# Patient Record
Sex: Male | Born: 1960 | Race: White | Hispanic: No | Marital: Married | Smoking: Never smoker
Health system: Southern US, Community
[De-identification: ages and names within clinical notes are randomized; demographics above are authoritative.]

## PROBLEM LIST (undated history)

## (undated) DIAGNOSIS — E119 Type 2 diabetes mellitus without complications: Secondary | ICD-10-CM

## (undated) DIAGNOSIS — K219 Gastro-esophageal reflux disease without esophagitis: Secondary | ICD-10-CM

## (undated) DIAGNOSIS — K5190 Ulcerative colitis, unspecified, without complications: Secondary | ICD-10-CM

## (undated) HISTORY — PX: COLONOSCOPY: SHX174

## (undated) HISTORY — PX: KNEE ARTHROSCOPY: SUR90

## (undated) HISTORY — PX: FLEXIBLE SIGMOIDOSCOPY: SHX1649

## (undated) HISTORY — PX: ESOPHAGOGASTRODUODENOSCOPY: SHX1529

---

## 2005-08-22 ENCOUNTER — Ambulatory Visit: Payer: Self-pay | Admitting: Unknown Physician Specialty

## 2005-09-13 ENCOUNTER — Ambulatory Visit: Payer: Self-pay | Admitting: Chiropractic Medicine

## 2007-10-22 ENCOUNTER — Ambulatory Visit: Payer: Self-pay | Admitting: Family Medicine

## 2009-08-05 ENCOUNTER — Ambulatory Visit: Payer: Self-pay | Admitting: Unknown Physician Specialty

## 2010-03-26 ENCOUNTER — Ambulatory Visit: Payer: Self-pay

## 2012-07-24 ENCOUNTER — Emergency Department: Payer: Self-pay | Admitting: Emergency Medicine

## 2012-07-24 LAB — CBC
HGB: 14.1 g/dL (ref 13.0–18.0)
MCHC: 34.2 g/dL (ref 32.0–36.0)
RBC: 5.05 10*6/uL (ref 4.40–5.90)
WBC: 8.8 10*3/uL (ref 3.8–10.6)

## 2012-07-24 LAB — BASIC METABOLIC PANEL
Anion Gap: 4 — ABNORMAL LOW (ref 7–16)
Co2: 31 mmol/L (ref 21–32)
EGFR (African American): >60
Potassium: 4.1 mmol/L (ref 3.5–5.1)

## 2015-08-17 DIAGNOSIS — G8929 Other chronic pain: Secondary | ICD-10-CM | POA: Insufficient documentation

## 2015-08-18 ENCOUNTER — Other Ambulatory Visit: Payer: Self-pay | Admitting: Unknown Physician Specialty

## 2015-08-18 DIAGNOSIS — G8929 Other chronic pain: Secondary | ICD-10-CM

## 2015-08-18 DIAGNOSIS — M25562 Pain in left knee: Principal | ICD-10-CM

## 2015-10-09 ENCOUNTER — Ambulatory Visit (INDEPENDENT_AMBULATORY_CARE_PROVIDER_SITE_OTHER): Payer: BLUE CROSS/BLUE SHIELD

## 2015-10-09 ENCOUNTER — Encounter: Payer: Self-pay | Admitting: Podiatry

## 2015-10-09 ENCOUNTER — Ambulatory Visit (INDEPENDENT_AMBULATORY_CARE_PROVIDER_SITE_OTHER): Payer: BLUE CROSS/BLUE SHIELD | Admitting: Podiatry

## 2015-10-09 VITALS — BP 141/89 | HR 79 | Resp 12

## 2015-10-09 DIAGNOSIS — M722 Plantar fascial fibromatosis: Secondary | ICD-10-CM | POA: Diagnosis not present

## 2015-10-09 MED ORDER — TRIAMCINOLONE ACETONIDE 10 MG/ML IJ SUSP
10.0000 mg | Freq: Once | INTRAMUSCULAR | Status: AC
  Administered 2015-10-09: 10 mg

## 2015-10-09 MED ORDER — DICLOFENAC SODIUM 75 MG PO TBEC: 75.0000 mg | DELAYED_RELEASE_TABLET | Freq: Two times a day (BID) | ORAL | Status: DC

## 2015-10-09 NOTE — Progress Notes (Signed)
   Subjective:    Patient ID: Larry AtesKenneth R Keator, male    DOB: [birth year], 55 y.o.   MRN: [id number]  HPI  PT STATED RT FOOT HEEL IS WORSE THAN LT FOOT FOR 1 MONTH. FEET ARE WORSE ESPECIALLY WHEN WALKING AND TRIED NO TREATMENT.  Review of Systems  Musculoskeletal: Positive for gait problem.       Objective:   Physical Exam        Assessment & Plan:

## 2015-10-09 NOTE — Patient Instructions (Signed)
Plantar Fasciitis Plantar fasciitis is a painful foot condition that affects the heel. It occurs when the band of tissue that connects the toes to the heel bone (plantar fascia) becomes irritated. This can happen after exercising too much or doing other repetitive activities (overuse injury). The pain from plantar fasciitis can range from mild irritation to severe pain that makes it difficult for you to walk or move. The pain is usually worse in the morning or after you have been sitting or lying down for a while. CAUSES This condition may be caused by:  Standing for long periods of time.  Wearing shoes that do not fit.  Doing high-impact activities, including running, aerobics, and ballet.  Being overweight.  Having an abnormal way of walking (gait).  Having tight calf muscles.  Having high arches in your feet.  Starting a new athletic activity. SYMPTOMS The main symptom of this condition is heel pain. Other symptoms include:  Pain that gets worse after activity or exercise.  Pain that is worse in the morning or after resting.  Pain that goes away after you walk for a few minutes. DIAGNOSIS This condition may be diagnosed based on your signs and symptoms. Your health care provider will also do a physical exam to check for:  A tender area on the bottom of your foot.  A high arch in your foot.  Pain when you move your foot.  Difficulty moving your foot. You may also need to have imaging studies to confirm the diagnosis. These can include:  X-rays.  Ultrasound.  MRI. TREATMENT  Treatment for plantar fasciitis depends on the severity of the condition. Your treatment may include:  Rest, ice, and over-the-counter pain medicines to manage your pain.  Exercises to stretch your calves and your plantar fascia.  A splint that holds your foot in a stretched, upward position while you sleep (night splint).  Physical therapy to relieve symptoms and prevent problems in the  future.  Cortisone injections to relieve severe pain.  Extracorporeal shock wave therapy (ESWT) to stimulate damaged plantar fascia with electrical impulses. It is often used as a last resort before surgery.  Surgery, if other treatments have not worked after 12 months. HOME CARE INSTRUCTIONS  Take medicines only as directed by your health care provider.  Avoid activities that cause pain.  Roll the bottom of your foot over a bag of ice or a bottle of cold water. Do this for 20 minutes, 3-4 times a day.  Perform simple stretches as directed by your health care provider.  Try wearing athletic shoes with air-sole or gel-sole cushions or soft shoe inserts.  Wear a night splint while sleeping, if directed by your health care provider.  Keep all follow-up appointments with your health care provider. PREVENTION   Do not perform exercises or activities that cause heel pain.  Consider finding low-impact activities if you continue to have problems.  Lose weight if you need to. The best way to prevent plantar fasciitis is to avoid the activities that aggravate your plantar fascia. SEEK MEDICAL CARE IF:  Your symptoms do not go away after treatment with home care measures.  Your pain gets worse.  Your pain affects your ability to move or do your daily activities.   This information is not intended to replace advice given to you by your health care provider. Make sure you discuss any questions you have with your health care provider.   Document Released: 03/22/2001 Document Revised: 03/18/2015 Document Reviewed: 05/07/2014 Elsevier   Interactive Patient Education 2016 [employer]  

## 2015-10-09 NOTE — Progress Notes (Signed)
Subjective:     Patient ID: Larry AtesKenneth R Zimmerle, male   DOB: 07-24-1960, 55 y.o.   MRN: [id number]  HPI patient presents stating he's having a lot of heel pain right over left and it's been going on for a while and is worsened over the last month. He's had a history at this and the past and does have relatively flatfoot deformity   Review of Systems  All other systems reviewed and are negative.      Objective:   Physical Exam  Constitutional: He is oriented to person, place, and time.  Cardiovascular: Intact distal pulses.   Musculoskeletal: Normal range of motion.  Neurological: He is oriented to person, place, and time.  Skin: Skin is warm.  Nursing note and vitals reviewed.  neurovascular status is found to be intact with muscle strength adequate range of motion within normal limits with patient found to have exquisite discomfort plantar fascial right over left at the insertional point of the tendon into the calcaneus. Patient is noted to have moderate flatfoot deformity bilateral and there is fluid around the medial tendon. Patient has good digital perfusion and is well oriented 3     Assessment:     Plantar fasciitis right over left with inflammatory changes    Plan:     H&P and x-rays reviewed with patient. Today I injected the plantar fascia bilateral 3 mg Kenalog 5 mill grams Xylocaine and applied fascial brace right to support the arch. Gave instructions on physical therapy and placed on diclofenac 75 mg twice a day. Patient will be seen back in 1 week  X-ray report indicated spur formation right over left with depression of the arch bilateral

## 2015-10-13 ENCOUNTER — Ambulatory Visit: Payer: Self-pay | Admitting: Sports Medicine

## 2015-10-16 ENCOUNTER — Ambulatory Visit: Payer: Self-pay | Admitting: Podiatry

## 2015-10-26 ENCOUNTER — Ambulatory Visit (INDEPENDENT_AMBULATORY_CARE_PROVIDER_SITE_OTHER): Payer: BLUE CROSS/BLUE SHIELD | Admitting: Podiatry

## 2015-10-26 ENCOUNTER — Encounter: Payer: Self-pay | Admitting: Podiatry

## 2015-10-26 DIAGNOSIS — M722 Plantar fascial fibromatosis: Secondary | ICD-10-CM | POA: Diagnosis not present

## 2015-10-27 NOTE — Progress Notes (Signed)
Subjective:     Patient ID: Larry AtesKenneth R Calix, male   DOB: 10-24-1960, 55 y.o.   MRN: [id number]  HPI patient states both my heels are doing well but I do have a lot of pain and I have had a long-term history of this problem   Review of Systems     Objective:   Physical Exam Neurovascular status intact negative Homans sign noted with depression of the arch noted bilateral with inflammation around the plantar heel region bilateral that's diminished and discomfort still present    Assessment:     Structural changes with long-term history of plantar fasciitis with weightbearing job on cement floors with history of chronic fasciitis    Plan:     H&P x-rays reviewed and today I went ahead and I advised on continued physical therapy continued stretching exercises and scanned for custom orthotic devices to lift the arch up and take stress off the heel

## 2015-11-20 ENCOUNTER — Ambulatory Visit (INDEPENDENT_AMBULATORY_CARE_PROVIDER_SITE_OTHER): Payer: BLUE CROSS/BLUE SHIELD | Admitting: Podiatry

## 2015-11-20 DIAGNOSIS — M722 Plantar fascial fibromatosis: Secondary | ICD-10-CM | POA: Diagnosis not present

## 2015-11-20 MED ORDER — TRIAMCINOLONE ACETONIDE 10 MG/ML IJ SUSP
10.0000 mg | Freq: Once | INTRAMUSCULAR | Status: AC
  Administered 2015-11-20: 10 mg

## 2015-11-20 NOTE — Progress Notes (Signed)
Pt presents today to [facility], orthotics dispensed and instructions given. Pt is to follow up with any questions or concerns. 

## 2015-11-20 NOTE — Patient Instructions (Signed)

## 2015-11-20 NOTE — Progress Notes (Signed)
Subjective:     Patient ID: Larry AtesKenneth R Lloyd, male   DOB: 08-28-1960, 55 y.o.   MRN: [id number]  HPI patient states he has had a reoccurrence of pain in the right plantar heel and he is here to pickup orthotics   Review of Systems     Objective:   Physical Exam Neurovascular status intact muscle strength adequate with exquisite discomfort plantar aspect right at the insertional point tendon into the calcaneus    Assessment:     Continued plantar fasciitis right plantar heel    Plan:     Reinjected the plantar fascia right 3 mg Kenalog 5 mill grams Xylocaine and instructed on continued physical therapy orthotic usage

## 2015-12-18 ENCOUNTER — Ambulatory Visit (INDEPENDENT_AMBULATORY_CARE_PROVIDER_SITE_OTHER): Payer: BLUE CROSS/BLUE SHIELD | Admitting: Podiatry

## 2015-12-18 ENCOUNTER — Encounter: Payer: Self-pay | Admitting: Podiatry

## 2015-12-18 VITALS — BP 125/66 | HR 76 | Resp 14

## 2015-12-18 DIAGNOSIS — M722 Plantar fascial fibromatosis: Secondary | ICD-10-CM

## 2015-12-18 MED ORDER — PREDNISONE 10 MG PO TABS: ORAL_TABLET | ORAL | Status: DC

## 2015-12-18 MED ORDER — TRIAMCINOLONE ACETONIDE 10 MG/ML IJ SUSP
10.0000 mg | Freq: Once | INTRAMUSCULAR | Status: AC
  Administered 2015-12-18: 10 mg

## 2016-01-04 DIAGNOSIS — K5190 Ulcerative colitis, unspecified, without complications: Secondary | ICD-10-CM | POA: Insufficient documentation

## 2016-01-08 ENCOUNTER — Ambulatory Visit: Payer: BLUE CROSS/BLUE SHIELD | Admitting: Podiatry

## 2016-04-08 NOTE — Progress Notes (Signed)
Subjective:     Patient ID: Larry Lloyd, male   DOB: 20-Mar-1961, 55 y.o.   MRN: [id number]  HPI patient presents stating his still having pain in the heel with slight improvement but still painful   Review of Systems     Objective:   Physical Exam Neurovascular status intact with continued discomfort in the plantar fascial bands    Assessment:     Plantar fasciitis noted with pain    Plan:     I reinjected plantar fascia bilateral 3 mg Kenalog 5 mill grams Xylocaine and gave instructions on physical therapy and dispensed night splint with instructions on usage

## 2017-01-12 DIAGNOSIS — E119 Type 2 diabetes mellitus without complications: Secondary | ICD-10-CM | POA: Insufficient documentation

## 2018-01-24 DIAGNOSIS — E538 Deficiency of other specified B group vitamins: Secondary | ICD-10-CM | POA: Insufficient documentation

## 2019-03-13 ENCOUNTER — Other Ambulatory Visit: Payer: Self-pay | Admitting: Orthopedic Surgery

## 2019-03-13 DIAGNOSIS — M25362 Other instability, left knee: Secondary | ICD-10-CM

## 2019-03-13 DIAGNOSIS — M2392 Unspecified internal derangement of left knee: Secondary | ICD-10-CM

## 2019-03-13 DIAGNOSIS — M1712 Unilateral primary osteoarthritis, left knee: Secondary | ICD-10-CM

## 2019-03-21 ENCOUNTER — Other Ambulatory Visit: Payer: Self-pay

## 2019-03-21 ENCOUNTER — Ambulatory Visit
Admission: RE | Admit: 2019-03-21 | Discharge: 2019-03-21 | Disposition: A | Discharge status: Home / Self Care | Payer: BC Managed Care – PPO | Source: Ambulatory Visit | Attending: Orthopedic Surgery | Admitting: Orthopedic Surgery

## 2019-03-21 DIAGNOSIS — M1712 Unilateral primary osteoarthritis, left knee: Secondary | ICD-10-CM | POA: Diagnosis present

## 2019-03-21 DIAGNOSIS — M25362 Other instability, left knee: Secondary | ICD-10-CM | POA: Insufficient documentation

## 2019-03-21 DIAGNOSIS — M2392 Unspecified internal derangement of left knee: Secondary | ICD-10-CM | POA: Diagnosis present

## 2019-07-16 ENCOUNTER — Ambulatory Visit: Payer: BC Managed Care – PPO | Attending: Internal Medicine

## 2019-07-16 DIAGNOSIS — Z20822 Contact with and (suspected) exposure to covid-19: Secondary | ICD-10-CM

## 2019-07-16 DIAGNOSIS — U071 COVID-19: Secondary | ICD-10-CM | POA: Insufficient documentation

## 2019-07-18 ENCOUNTER — Telehealth: Payer: Self-pay

## 2019-07-18 LAB — NOVEL CORONAVIRUS, NAA: SARS-CoV-2, NAA: DETECTED — AB

## 2019-07-18 NOTE — Telephone Encounter (Signed)
Patient called and was informed that his COVID-19 test 07/16/19 was positive and he can pass the germ to others. He states he has sore throat , feels weak, and has lost taste.  Symptom tier and treatment plan for each tier was read to patient. Criteria for ending isolation were read to patient. Good preventative practices were reviewed. He verbalized understanding of all information. [facility] will be notified.

## 2019-08-14 DIAGNOSIS — Z8616 Personal history of COVID-19: Secondary | ICD-10-CM | POA: Insufficient documentation

## 2020-01-27 ENCOUNTER — Ambulatory Visit
Admission: RE | Admit: 2020-01-27 | Discharge: 2020-01-27 | Disposition: A | Discharge status: Home / Self Care | Payer: BC Managed Care – PPO | Source: Ambulatory Visit | Attending: Physician Assistant | Admitting: Physician Assistant

## 2020-01-27 ENCOUNTER — Other Ambulatory Visit: Payer: Self-pay

## 2020-01-27 ENCOUNTER — Other Ambulatory Visit: Payer: Self-pay | Admitting: Physician Assistant

## 2020-01-27 DIAGNOSIS — M7989 Other specified soft tissue disorders: Secondary | ICD-10-CM

## 2020-07-07 ENCOUNTER — Encounter: Payer: Self-pay | Admitting: Podiatry

## 2020-07-07 ENCOUNTER — Other Ambulatory Visit: Payer: Self-pay | Admitting: Podiatry

## 2020-07-07 ENCOUNTER — Ambulatory Visit: Payer: BC Managed Care – PPO | Admitting: Podiatry

## 2020-07-07 ENCOUNTER — Other Ambulatory Visit: Payer: Self-pay

## 2020-07-07 ENCOUNTER — Ambulatory Visit (INDEPENDENT_AMBULATORY_CARE_PROVIDER_SITE_OTHER): Payer: BC Managed Care – PPO

## 2020-07-07 DIAGNOSIS — M7752 Other enthesopathy of left foot: Secondary | ICD-10-CM

## 2020-07-07 DIAGNOSIS — M779 Enthesopathy, unspecified: Secondary | ICD-10-CM

## 2020-07-07 NOTE — Progress Notes (Signed)
  Subjective:  Patient ID: Larry Lloyd, male    DOB: September 20, 1960,  MRN: [id number]  Chief Complaint  Patient presents with  . Ankle Pain    59 y.o. male presents with the above complaint.  Patient presents with complaint left ankle medial gutter pain.  Patient states it painful to walk on it.  Patient states it has been going on for about 2 to 3 weeks.  He denies any acute injury.  There is pain and swelling associated with it.  Pain comes and goes sharp pain wakes him up at night.  Has tried Tylenol and icing it none of which has helped.  He would like to discuss treatment options he has not seen anyone else prior to seeing me.   Review of Systems: Negative except as noted in the HPI. Denies N/V/F/Ch.  No past medical history on file.  Current Outpatient Medications:  .  metFORMIN (GLUCOPHAGE) 500 MG tablet, Take by mouth., Disp: , Rfl:  .  balsalazide (COLAZAL) 750 MG capsule, TAKE 3 CAPSULES (2,250 MG TOTAL) BY MOUTH 2 (TWO) TIMES DAILY., Disp: , Rfl: 1  Social History   Tobacco Use  Smoking Status Never Smoker  Smokeless Tobacco Not on file    No Known Allergies Objective:  There were no vitals filed for this visit. There is no height or weight on file to calculate BMI. Constitutional Well developed. Well nourished.  Vascular Dorsalis pedis pulses palpable bilaterally. Posterior tibial pulses palpable bilaterally. Capillary refill normal to all digits.  No cyanosis or clubbing noted. Pedal hair growth normal.  Neurologic Normal speech. Oriented to person, place, and time. Epicritic sensation to light touch grossly present bilaterally.  Dermatologic Nails well groomed and normal in appearance. No open wounds. No skin lesions.  Orthopedic:  Pain on palpation of left ankle medial gutter.  Pain with range of motion of the ankle joint active and passive dorsiflexion and plantarflexion.  Deep intra-articular pain noted.  No pain at the posterior tibial tendon, peroneal  tendon, ATFL, Achilles tendon.   Radiographs: 3 views of skeletally mature adult left ankle.  Mild arthritic changes noted to the ankle joint.  Midfoot arthritis noted.  No other bony abnormalities identified. Assessment:   1. Capsulitis of ankle, left    Plan:  Patient was evaluated and treated and all questions answered.  Left ankle capsulitis -I explained to the patient the etiology of capsulitis and various treatment options were discussed.  Given that patient's pain is aggravated with activities I believe patient will benefit from a steroid injection.  Patient is a diabetic I discussed with him that he can elevate the sugar a little bit however generally does not get absorbed however patient would like to proceed despite the risks. -A steroid injection was performed at left ankle joint using 1% plain Lidocaine and 10 mg of Kenalog. This was well tolerated. -If there is no improvement we will discuss cam boot immobilization during next clinical visit  No follow-ups on file.

## 2020-07-24 ENCOUNTER — Ambulatory Visit
Admission: EM | Admit: 2020-07-24 | Discharge: 2020-07-24 | Disposition: A | Discharge status: Home / Self Care | Payer: BC Managed Care – PPO | Attending: Family Medicine | Admitting: Family Medicine

## 2020-07-24 DIAGNOSIS — Z23 Encounter for immunization: Secondary | ICD-10-CM | POA: Diagnosis not present

## 2020-07-24 DIAGNOSIS — L089 Local infection of the skin and subcutaneous tissue, unspecified: Secondary | ICD-10-CM

## 2020-07-24 MED ORDER — TETANUS-DIPHTH-ACELL PERTUSSIS 5-2.5-18.5 LF-MCG/0.5 IM SUSY
0.5000 mL | PREFILLED_SYRINGE | Freq: Once | INTRAMUSCULAR | Status: AC
  Administered 2020-07-24: 0.5 mL via INTRAMUSCULAR

## 2020-07-24 MED ORDER — CEFTRIAXONE SODIUM 1 G IJ SOLR
1.0000 g | Freq: Once | INTRAMUSCULAR | Status: AC
  Administered 2020-07-24: 1 g via INTRAMUSCULAR

## 2020-07-24 MED ORDER — AMOXICILLIN-POT CLAVULANATE 875-125 MG PO TABS
1.0000 | ORAL_TABLET | Freq: Two times a day (BID) | ORAL | 0 refills | Status: DC
Start: 2020-07-24 — End: 2023-05-03

## 2020-07-24 NOTE — ED Provider Notes (Signed)
Larry Lloyd    CSN: [id number] Arrival date & time: 07/24/20  0809      History   Chief Complaint Chief Complaint  Patient presents with  . Finger Injury    HPI Larry Lloyd is a 60 y.o. male.   Pt is a 60 year old male with past medical history of diabetes that presents with left ring finger injury.  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  Since he has had increased swelling, pain, increased warmth and redness.  Open area to the finger with drainage.  Limited flexion.  No fever, chills.     History reviewed. No pertinent past medical history.  Patient Active Problem List   Diagnosis Date Noted  . History of 2019 novel coronavirus disease (COVID-19) 08/14/2019  . B12 deficiency 01/24/2018  . Type 2 diabetes mellitus without complication, without long-term current use of insulin (HCC) 01/12/2017  . Chronic ulcerative colitis without complication (HCC) 01/04/2016  . Chronic pain of left knee 08/17/2015    History reviewed. No pertinent surgical history.     Home Medications    Prior to Admission medications   Medication Sig Start Date End Date Taking? Authorizing Provider  amoxicillin-clavulanate (AUGMENTIN) 875-125 MG tablet Take 1 tablet by mouth every 12 (twelve) hours. 07/24/20  Yes Ossiel Marchio A, NP  balsalazide (COLAZAL) 750 MG capsule TAKE 3 CAPSULES (2,250 MG TOTAL) BY MOUTH 2 (TWO) TIMES DAILY. 08/12/15   [provider]  metFORMIN (GLUCOPHAGE) 500 MG tablet Take by mouth. 02/13/20   [provider]    Family History Family History  Family history unknown: Yes    Social History Social History   Tobacco Use  . Smoking status: Never Smoker  Substance Use Topics  . Alcohol use: No  . Drug use: No     Allergies   Patient has no known allergies.   Review of Systems Review of Systems   Physical Exam Triage Vital Signs ED Triage Vitals [07/24/20 0820]  Enc Vitals Group      BP      Pulse      Resp      Temp      Temp src      SpO2      Weight      Height      Head Circumference      Peak Flow      Pain Score 4     Pain Loc      Pain Edu?      Excl. in GC?    No data found.  Updated Vital Signs BP 139/82   Pulse 73   Temp 98.4 F (36.9 C)   Resp 18   SpO2 97%   Visual Acuity Right Eye Distance:   Left Eye Distance:   Bilateral Distance:    Right Eye Near:   Left Eye Near:    Bilateral Near:     Physical Exam Vitals and nursing note reviewed.  Constitutional:      Appearance: Normal appearance.  HENT:     Head: Normocephalic and atraumatic.  Eyes:     Conjunctiva/sclera: Conjunctivae normal.  Pulmonary:     Effort: Pulmonary effort is normal.  Musculoskeletal:        General: Normal range of motion.     Cervical back: Normal range of motion.  Skin:    General: Skin is warm and dry.  Comments: See picture for detail   Neurological:     Mental Status: He is alert.  Psychiatric:        Mood and Affect: Mood normal.        UC Treatments / Results  Labs (all labs ordered are listed, but only abnormal results are displayed) Labs Reviewed - No data to display  EKG   Radiology No results found.  Procedures Procedures (including critical care time)  Medications Ordered in UC Medications  Tdap (BOOSTRIX) injection 0.5 mL (0.5 mLs Intramuscular Given 07/24/20 0843)  cefTRIAXone (ROCEPHIN) injection 1 g (1 g Intramuscular Given 07/24/20 0844)    Initial Impression / Assessment and Plan / UC Course  I have reviewed the triage vital signs and the nursing notes.  Pertinent labs & imaging results that were available during my care of the patient were reviewed by me and considered in my medical decision making (see chart for details).     Finger infection Treated aggressively here today based on hx of diabetes and infection spreading into the hand Treated  with Rocephin injection given in clinic.  We will send  Augmentin to the pharmacy to take twice a day for a week. Finger already has open area that is draining.  Recommended warm compresses, warm soaks and massage to promote more drainage For worsening problems he will need to go to the ED. Final Clinical Impressions(s) / UC Diagnoses   Final diagnoses:  Finger infection     Discharge Instructions     We are treating you for finger infection. Antibiotic shot given here today.  We are also sending antibiotics to take to the pharmacy.  Take this twice a day for the next week. Rest, elevate the hand.   Warm soaks of the finger. If symptoms worsen despite this treatment you need to go to the ER  Follow up as needed for continued or worsening symptoms     ED Prescriptions    Medication Sig Dispense Auth. Provider   amoxicillin-clavulanate (AUGMENTIN) 875-125 MG tablet Take 1 tablet by mouth every 12 (twelve) hours. 14 tablet Daphine Loch A, NP     PDMP not reviewed this encounter.   Janace Aris, NP 07/24/20 [phone]

## 2020-07-24 NOTE — ED Triage Notes (Signed)
Pt presents with left finger injury where he was injured by welding rod on Monday, 4th finger is swollen, unknown last tetanus

## 2020-07-24 NOTE — Discharge Instructions (Addendum)
We are treating you for finger infection. Antibiotic shot given here today.  We are also sending antibiotics to take to the pharmacy.  Take this twice a day for the next week. Rest, elevate the hand.   Warm soaks of the finger. If symptoms worsen despite this treatment you need to go to the ER  Follow up as needed for continued or worsening symptoms

## 2020-08-04 ENCOUNTER — Encounter: Payer: Self-pay | Admitting: Podiatry

## 2020-08-04 ENCOUNTER — Ambulatory Visit: Payer: BC Managed Care – PPO | Admitting: Podiatry

## 2020-08-04 ENCOUNTER — Other Ambulatory Visit: Payer: Self-pay

## 2020-08-04 DIAGNOSIS — M7752 Other enthesopathy of left foot: Secondary | ICD-10-CM

## 2020-08-04 DIAGNOSIS — M25372 Other instability, left ankle: Secondary | ICD-10-CM | POA: Diagnosis not present

## 2020-08-04 NOTE — Progress Notes (Signed)
  Subjective:  Patient ID: Larry Lloyd, male    DOB: May 29, 1961,  MRN: [id number]  No chief complaint on file.   60 y.o. male presents with the above complaint.  Patient presents with a follow-up of left ankle capsulitis.  Patient states injection helped considerably.  She is about 90% improved.  He still gets occasional pain when hitting a certain spot the right leg.  There is distal swelling present.  He denies any other acute complaints.   Review of Systems: Negative except as noted in the HPI. Denies N/V/F/Ch.  History reviewed. No pertinent past medical history.  Current Outpatient Medications:  .  amoxicillin-clavulanate (AUGMENTIN) 875-125 MG tablet, Take 1 tablet by mouth every 12 (twelve) hours., Disp: 14 tablet, Rfl: 0 .  balsalazide (COLAZAL) 750 MG capsule, TAKE 3 CAPSULES (2,250 MG TOTAL) BY MOUTH 2 (TWO) TIMES DAILY., Disp: , Rfl: 1 .  metFORMIN (GLUCOPHAGE) 500 MG tablet, Take by mouth., Disp: , Rfl:   Social History   Tobacco Use  Smoking Status Never Smoker  Smokeless Tobacco Not on file    No Known Allergies Objective:  There were no vitals filed for this visit. There is no height or weight on file to calculate BMI. Constitutional Well developed. Well nourished.  Vascular Dorsalis pedis pulses palpable bilaterally. Posterior tibial pulses palpable bilaterally. Capillary refill normal to all digits.  No cyanosis or clubbing noted. Pedal hair growth normal.  Neurologic Normal speech. Oriented to person, place, and time. Epicritic sensation to light touch grossly present bilaterally.  Dermatologic Nails well groomed and normal in appearance. No open wounds. No skin lesions.  Orthopedic:  Mild pain on palpation of left ankle medial gutter.  Mild pain with range of motion of the ankle joint active and passive dorsiflexion and plantarflexion.  No deep intra-articular pain noted.  No pain at the posterior tibial tendon, peroneal tendon, ATFL, Achilles tendon.    Radiographs: 3 views of skeletally mature adult left ankle.  Mild arthritic changes noted to the ankle joint.  Midfoot arthritis noted.  No other bony abnormalities identified. Assessment:   1. Capsulitis of ankle, left   2. Ankle instability, left    Plan:  Patient was evaluated and treated and all questions answered.  Left ankle capsulitis -Clinically her pain has greater than 90% improved with the steroid injection.  At this time patient notices occasionally.  At this time I believe she will benefit from Tri-Lock ankle brace to give stability to the ankle joint while ambulating.  I encouraged him to utilize Tri-Lock ankle brace in his regular shoes.  He states understanding and will do so. -If there is no improvement we will discuss getting an MRI during next clinical visit if needed.  Before now I encouraged him to utilize a brace.  He states understanding and will do so  No follow-ups on file.

## 2020-10-08 IMAGING — MR MR KNEE*L* W/O CM
6 series · 40 of 40 positions shown · non-contrast
Comparison: None.

CLINICAL DATA: Chronic left knee pain.

EXAM:
MRI OF THE LEFT KNEE WITHOUT CONTRAST
TECHNIQUE: Multiplanar, multisequence MR imaging of the knee was performed. No
intravenous contrast was administered.

[Series 8: T2 fat-sat · axial · left · 4.0mm · 0.50mm/px · z∈[-95,+30]mm · 5 of 26 slices shown (1 of 3)]
[im 1/26]
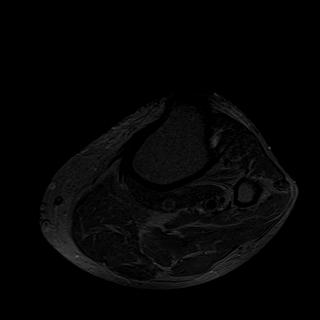
[im 7/26]
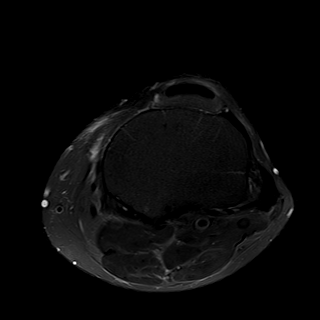
[im 13/26]
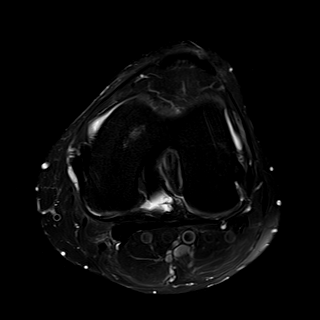
[im 19/26]
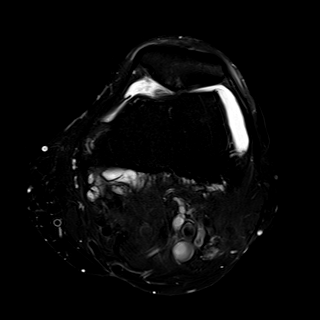
[im 26/26]
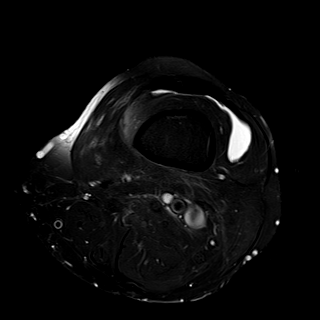

[Series 9: T2 fat-sat · coronal · left · 4.0mm · 0.59mm/px · 6 of 28 slices shown (2 of 3)]
[im 1/28]
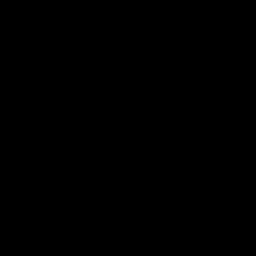
[im 6/28]
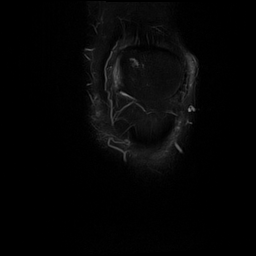
[im 11/28]
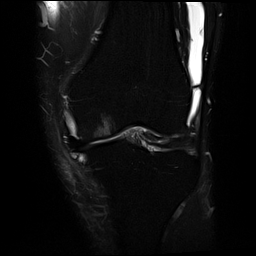
[im 17/28]
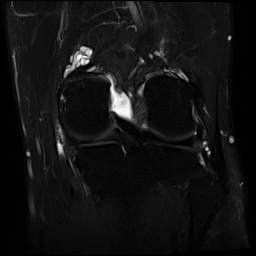
[im 22/28]
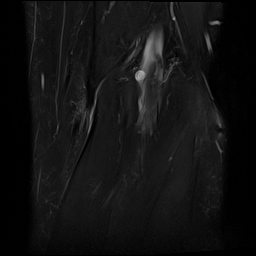
[im 28/28]
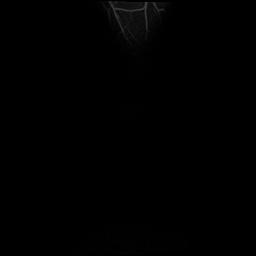

[Series 10: T1 · coronal · left · 4.0mm · 0.59mm/px · 7 of 30 slices shown]
[im 1/30]
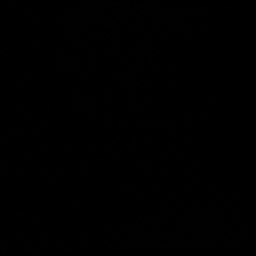
[im 5/30]
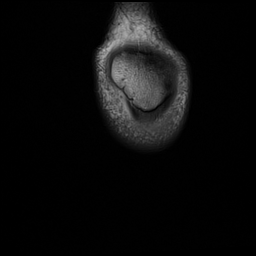
[im 10/30]
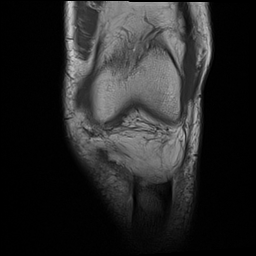
[im 15/30]
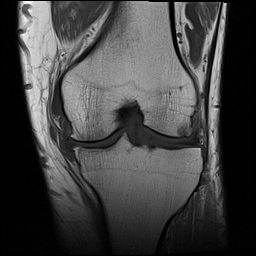
[im 20/30]
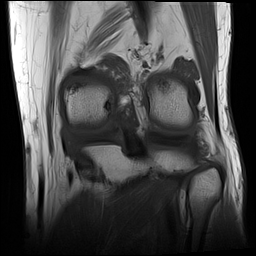
[im 25/30]
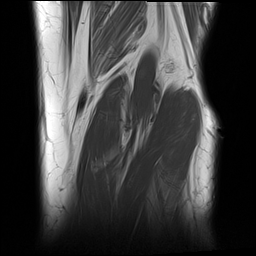
[im 30/30]
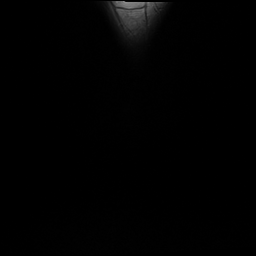

[Series 11: PD fat-sat · coronal · left · 4.0mm · 0.59mm/px · 7 of 29 slices shown (1 of 2)]
[im 1/29]
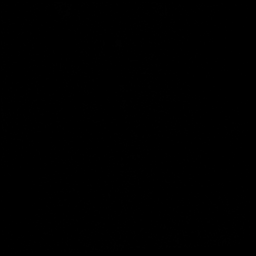
[im 5/29]
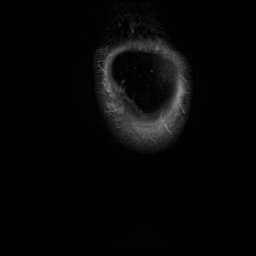
[im 10/29]
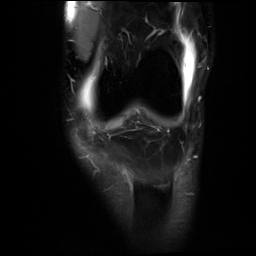
[im 15/29]
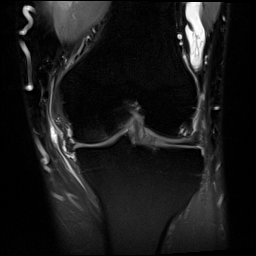
[im 19/29]
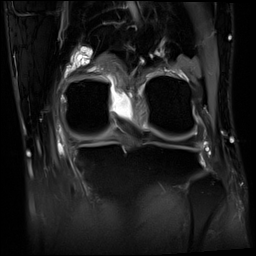
[im 24/29]
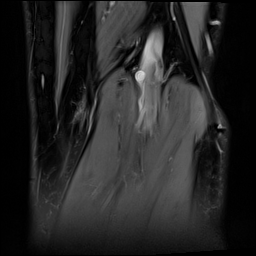
[im 29/29]
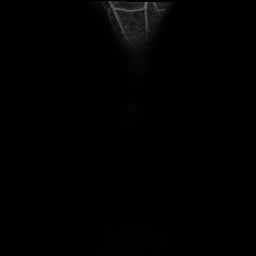

[Series 12: PD fat-sat · sagittal · left · 3.0mm · 0.59mm/px · 7 of 31 slices shown (2 of 2)]
[im 1/31]
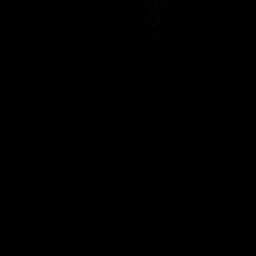
[im 6/31]
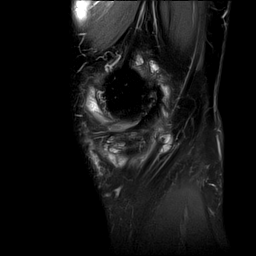
[im 11/31]
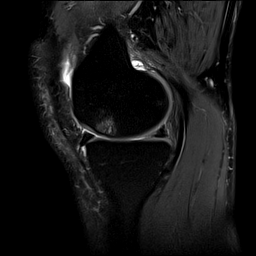
[im 16/31]
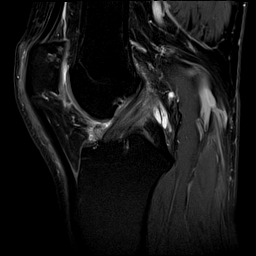
[im 21/31]
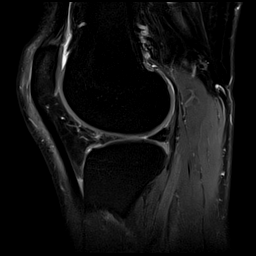
[im 26/31]
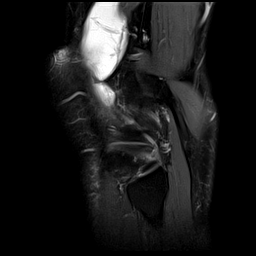
[im 31/31]
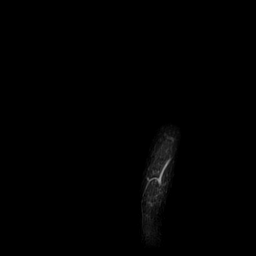

[Series 13: T2 fat-sat · sagittal · left · 3.0mm · 0.59mm/px · 8 of 34 slices shown (3 of 3)]
[im 1/34]
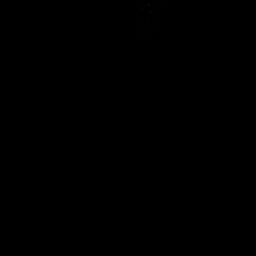
[im 5/34]
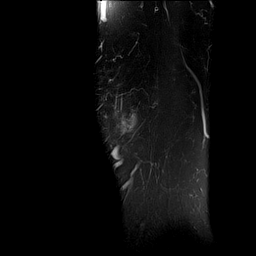
[im 10/34]
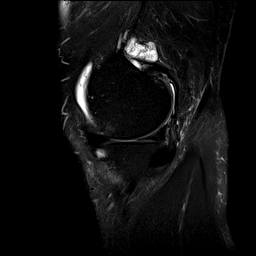
[im 15/34]
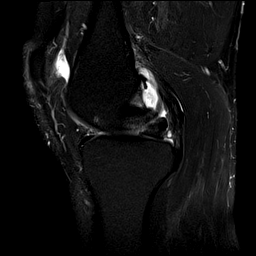
[im 19/34]
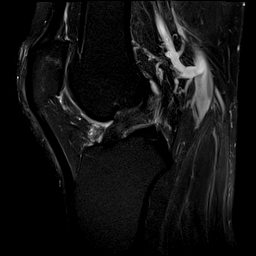
[im 24/34]
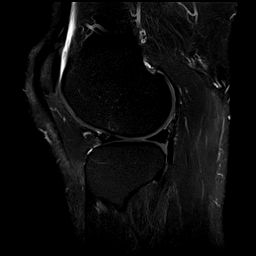
[im 29/34]
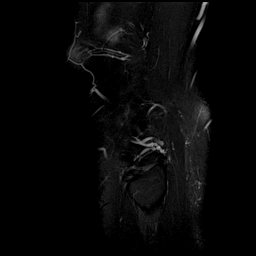
[im 34/34]
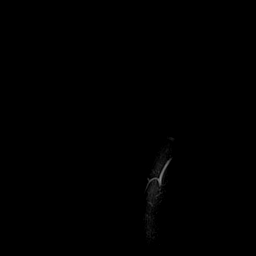

[40 of 40 positions shown; findings below may reference images not displayed]

FINDINGS: MENISCI

Medial meniscus: Radial tear of the body and posterior horn.
Additional small horizontal tear near the body/posterior horn
junction with adjacent parameniscal cyst.

Lateral meniscus: Degeneration of the anterior horn with fraying of
the femoral surface.

LIGAMENTS

Cruciates: Intact ACL and PCL. Mild mucoid degeneration of the ACL.

Collaterals: Medial collateral ligament is intact. Lateral
collateral ligament complex is intact.

CARTILAGE

Patellofemoral: High-grade partial-thickness cartilage loss over the
patellar apex with underlying subchondral marrow edema.

Medial: High-grade near full-thickness cartilage loss over the
central medial femoral condyle and medial tibial plateau with
subchondral marrow edema and cystic change in the medial femoral
condyle.

Lateral: 7 x 7 mm chronic degenerative osteochondral lesion of the
peripheral lateral femoral condyle.

Joint: Small joint effusion. Normal Hoffa's fat. No plical
thickening.

Popliteal Fossa:  Trace Baker cyst.  Intact popliteus tendon.

Extensor Mechanism: Intact quadriceps tendon and patellar tendon.
Intact medial and lateral patellar retinaculum. Intact MPFL.

Bones:  No acute fracture or dislocation.  No focal bone lesion.

Other: 2.0 x 1.3 cm multiloculated ganglion cyst near the medial
gastrocnemius tendon attachment.
IMPRESSION: 1. Radial tear of the medial meniscus body and posterior horn.
Additional small horizontal tear near the body/posterior horn
junction with adjacent parameniscal cyst.
2. Tricompartmental osteoarthritis as described above,
mild-to-moderate in the medial compartment. 7 mm chronic
degenerative osteochondral lesion of the peripheral lateral femoral
condyle.

## 2021-01-22 ENCOUNTER — Other Ambulatory Visit: Payer: Self-pay

## 2021-01-22 ENCOUNTER — Ambulatory Visit (INDEPENDENT_AMBULATORY_CARE_PROVIDER_SITE_OTHER): Payer: 59 | Admitting: Podiatry

## 2021-01-22 DIAGNOSIS — M7752 Other enthesopathy of left foot: Secondary | ICD-10-CM

## 2021-01-22 MED ORDER — BETAMETHASONE SOD PHOS & ACET 6 (3-3) MG/ML IJ SUSP
3.0000 mg | Freq: Once | INTRAMUSCULAR | Status: AC
  Administered 2021-01-22: 3 mg via INTRA_ARTICULAR

## 2021-01-22 NOTE — Progress Notes (Signed)
   Subjective:  60 y.o. male presenting today for evaluation of left ankle pain has been going on for the last few weeks.  Patient states that he had an acute flareup.  He was last seen here in the office on January 2022.  Patient states that he was treated for symptoms of heel pain.  Patient denies a history of injury.  He has noticed an increase in pain of the last few weeks.  He has not done anything for treatment.   No past medical history on file.   Objective / Physical Exam:  General:  The patient is alert and oriented x3 in no acute distress. Dermatology:  Skin is warm, dry and supple bilateral lower extremities. Negative for open lesions or macerations. Vascular:  Palpable pedal pulses bilaterally. No edema or erythema noted. Capillary refill within normal limits. Neurological:  Epicritic and protective threshold grossly intact bilaterally.  Musculoskeletal Exam:  Pain on palpation to the anterior lateral medial aspects of the patient's left ankle. Mild edema noted. Range of motion within normal limits to all pedal and ankle joints bilateral. Muscle strength 5/5 in all groups bilateral.   Radiographic Exam:  Normal osseous mineralization. Joint spaces preserved. No fracture/dislocation/boney destruction.    Assessment: 1.  Capsulitis left ankle lateral aspect  Plan of Care:  1. Patient was evaluated. X-Rays reviewed.  2. Injection of 0.5 mL Celestone Soluspan injected in the patient's left ankle lateral aspect. 3.  Ankle brace dispensed.  Wear daily 4.  Patient has a history of ulcerative colitis.  No anti-inflammatories were prescribed today 5.  Return to clinic as needed   Felecia Shelling, DPM [facility]  Dr. Felecia Shelling, DPM    [address] [place], [place] [address]                Office [phone]  Fax [phone]

## 2022-01-17 ENCOUNTER — Ambulatory Visit
Admission: RE | Admit: 2022-01-17 | Discharge: 2022-01-17 | Disposition: A | Discharge status: Home / Self Care | Payer: 59 | Attending: Gastroenterology | Admitting: Gastroenterology

## 2022-01-17 ENCOUNTER — Other Ambulatory Visit: Payer: Self-pay

## 2022-01-17 ENCOUNTER — Ambulatory Visit: Payer: 59 | Admitting: Anesthesiology

## 2022-01-17 ENCOUNTER — Encounter
Admission: RE | Disposition: A | Discharge status: Home / Self Care | Payer: Self-pay | Source: Home / Self Care | Attending: Gastroenterology

## 2022-01-17 ENCOUNTER — Encounter: Payer: Self-pay | Admitting: *Deleted

## 2022-01-17 DIAGNOSIS — E119 Type 2 diabetes mellitus without complications: Secondary | ICD-10-CM | POA: Diagnosis not present

## 2022-01-17 DIAGNOSIS — Z7984 Long term (current) use of oral hypoglycemic drugs: Secondary | ICD-10-CM | POA: Insufficient documentation

## 2022-01-17 DIAGNOSIS — Z539 Procedure and treatment not carried out, unspecified reason: Secondary | ICD-10-CM | POA: Insufficient documentation

## 2022-01-17 DIAGNOSIS — K5190 Ulcerative colitis, unspecified, without complications: Secondary | ICD-10-CM | POA: Diagnosis present

## 2022-01-17 DIAGNOSIS — K219 Gastro-esophageal reflux disease without esophagitis: Secondary | ICD-10-CM | POA: Insufficient documentation

## 2022-01-17 HISTORY — DX: Gastro-esophageal reflux disease without esophagitis: K21.9

## 2022-01-17 HISTORY — DX: Type 2 diabetes mellitus without complications: E11.9

## 2022-01-17 HISTORY — PX: COLONOSCOPY WITH PROPOFOL: SHX5780

## 2022-01-17 SURGERY — COLONOSCOPY WITH PROPOFOL
Anesthesia: General

## 2022-01-17 MED ORDER — PROPOFOL 10 MG/ML IV BOLUS
INTRAVENOUS | Status: AC
  Filled 2022-01-17: qty 20

## 2022-01-17 MED ORDER — SODIUM CHLORIDE 0.9 % IV SOLN: INTRAVENOUS | Status: DC

## 2022-01-17 MED ORDER — PROPOFOL 10 MG/ML IV BOLUS
INTRAVENOUS | Status: DC | PRN
  Administered 2022-01-17: 70 mg via INTRAVENOUS

## 2022-01-17 NOTE — Anesthesia Preprocedure Evaluation (Signed)
Anesthesia Evaluation  Patient identified by MRN, date of birth, ID band Patient awake    Reviewed: Allergy & Precautions, NPO status , Patient's Chart, lab work & pertinent test results  Airway Mallampati: III  TM Distance: >3 FB Neck ROM: full    Dental  (+) Chipped   Pulmonary neg pulmonary ROS, neg shortness of breath,    Pulmonary exam normal        Cardiovascular (-) anginanegative cardio ROS Normal cardiovascular exam     Neuro/Psych negative neurological ROS  negative psych ROS   GI/Hepatic Neg liver ROS, PUD, GERD  Controlled,  Endo/Other  negative endocrine ROSdiabetes, Type 2  Renal/GU negative Renal ROS  negative genitourinary   Musculoskeletal   Abdominal   Peds  Hematology negative hematology ROS (+)   Anesthesia Other Findings Past Medical History: No date: Diabetes mellitus without complication (HCC) No date: GERD (gastroesophageal reflux disease)  History reviewed. No pertinent surgical history.  BMI    Body Mass Index: 30.41 kg/m      Reproductive/Obstetrics negative OB ROS                             Anesthesia Physical Anesthesia Plan  ASA: 3  Anesthesia Plan: General   Post-op Pain Management:    Induction: Intravenous  PONV Risk Score and Plan: Propofol infusion and TIVA  Airway Management Planned: Natural Airway and Nasal Cannula  Additional Equipment:   Intra-op Plan:   Post-operative Plan:   Informed Consent: I have reviewed the patients History and Physical, chart, labs and discussed the procedure including the risks, benefits and alternatives for the proposed anesthesia with the patient or authorized representative who has indicated his/her understanding and acceptance.     Dental Advisory Given  Plan Discussed with: Anesthesiologist, CRNA and Surgeon  Anesthesia Plan Comments: (Patient consented for risks of anesthesia including but  not limited to:  - adverse reactions to medications - risk of airway placement if required - damage to eyes, teeth, lips or other oral mucosa - nerve damage due to positioning  - sore throat or hoarseness - Damage to heart, brain, nerves, lungs, other parts of body or loss of life  Patient voiced understanding.)        Anesthesia Quick Evaluation

## 2022-01-17 NOTE — Anesthesia Postprocedure Evaluation (Signed)
Anesthesia Post Note  Patient: Larry Lloyd  Procedure(s) Performed: COLONOSCOPY WITH PROPOFOL  Patient location during evaluation: Endoscopy Anesthesia Type: General Level of consciousness: awake and alert Pain management: pain level controlled Vital Signs Assessment: post-procedure vital signs reviewed and stable Respiratory status: spontaneous breathing, nonlabored ventilation, respiratory function stable and patient connected to nasal cannula oxygen Cardiovascular status: blood pressure returned to baseline and stable Postop Assessment: no apparent nausea or vomiting Comments: Lungs clear and no shortness of breath, patient instructed to call us, a doctor or 911 if he as any new shortness of breath or any other concerns   No notable events documented.   Last Vitals:  Vitals:   01/17/22 0936 01/17/22 0946  BP: 121/86 139/90  Pulse: 66 69  Resp: 18 20  Temp:    SpO2: 95% 98%    Last Pain:  Vitals:   01/17/22 0946  TempSrc:   PainSc: 0-No pain                 Cleda Mccreedy Hanley Rispoli

## 2022-01-17 NOTE — Care Plan (Signed)
Colonoscopy was cancelled due to vomiting after sedation started. Scope was not inserted. Will plan on EGD/Colonoscopy next time as patient with severe silent reflux as he requires three pillows to sleep.  Merlyn Lot MD, MPH

## 2022-01-17 NOTE — Transfer of Care (Signed)
Immediate Anesthesia Transfer of Care Note  Patient: Larry Lloyd  Procedure(s) Performed: COLONOSCOPY WITH PROPOFOL  Patient Location: PACU  Anesthesia Type:General  Level of Consciousness: awake and alert   Airway & Oxygen Therapy: Patient Spontanous Breathing and Patient connected to nasal cannula oxygen  Post-op Assessment: Report given to RN and Post -op Vital signs reviewed and stable  Post vital signs: Reviewed and stable  Last Vitals:  Vitals Value Taken Time  BP 121/86 01/17/22 0939  Temp 36.2 C 01/17/22 0926  Pulse 68 01/17/22 0948  Resp 19 01/17/22 0948  SpO2 97 % 01/17/22 0948  Vitals shown include unvalidated device data.  Last Pain:  Vitals:   01/17/22 0936  TempSrc:   PainSc: 0-No pain         Complications: No notable events documented.

## 2022-01-17 NOTE — Interval H&P Note (Signed)
History and Physical Interval Note:  01/17/2022 9:08 AM  Larry Lloyd  has presented today for surgery, with the diagnosis of UC.  The various methods of treatment have been discussed with the patient and family. After consideration of risks, benefits and other options for treatment, the patient has consented to  Procedure(s) with comments: COLONOSCOPY WITH PROPOFOL (N/A) - DM as a surgical intervention.  The patient's history has been reviewed, patient examined, no change in status, stable for surgery.  I have reviewed the patient's chart and labs.  Questions were answered to the patient's satisfaction.     Regis Bill  Ok to proceed with colonoscopy

## 2022-01-17 NOTE — H&P (Signed)
Outpatient short stay form Pre-procedure 01/17/2022  Regis Bill, MD  Primary Physician: Danella Penton, MD  Reason for visit:  IBD Surveillance  History of present illness:    61 y/o gentleman with history of UC here for surveillance colonoscopy. No blood thinners. No family history of GI malignancies. No significant abdominal surgeries.    Current Facility-Administered Medications:    0.9 %  sodium chloride infusion, , Intravenous, Continuous, Rich Paprocki, Rossie Muskrat, MD, Last Rate: 20 mL/hr at 01/17/22 0850, New Bag at 01/17/22 0850  Medications Prior to Admission  Medication Sig Dispense Refill Last Dose   balsalazide (COLAZAL) 750 MG capsule TAKE 3 CAPSULES (2,250 MG TOTAL) BY MOUTH 2 (TWO) TIMES DAILY.  1 01/16/2022   metFORMIN (GLUCOPHAGE) 500 MG tablet Take by mouth.   01/16/2022   amoxicillin-clavulanate (AUGMENTIN) 875-125 MG tablet Take 1 tablet by mouth every 12 (twelve) hours. (Patient not taking: Reported on 01/17/2022) 14 tablet 0 Not Taking   omeprazole (PRILOSEC) 40 MG capsule Take 40 mg by mouth daily. (Patient not taking: Reported on 01/17/2022)   Not Taking     No Known Allergies   Past Medical History:  Diagnosis Date   Diabetes mellitus without complication (HCC)    GERD (gastroesophageal reflux disease)     Review of systems:  Otherwise negative.    Physical Exam  Gen: Alert, oriented. Appears stated age.  HEENT: PERRLA. Lungs: No respiratory distress CV: RRR Abd: soft, benign, no masses Ext: No edema    Planned procedures: Proceed with colonoscopy. The patient understands the nature of the planned procedure, indications, risks, alternatives and potential complications including but not limited to bleeding, infection, perforation, damage to internal organs and possible oversedation/side effects from anesthesia. The patient agrees and gives consent to proceed.  Please refer to procedure notes for findings, recommendations and patient  disposition/instructions.     Regis Bill, MD [facility]

## 2022-01-17 NOTE — Progress Notes (Signed)
Patient discharged to home following cancelled Colonoscopy procedure with Dr Mia Creek.  Patient tolerated ginger ale without complaints of nausea/vomiting or abdominal pain.  Discussed with the patient and his spouse s/s to look for at home such as fever, cough, shortness of breath, and/or chest pain and to call for questions of concerns.  Informed the patient that we would be calling him in the morning to follow up with him.  Patient and his spouse verbalized understanding.  Discharged to home.

## 2022-01-18 ENCOUNTER — Encounter: Payer: Self-pay | Admitting: Gastroenterology

## 2022-03-04 ENCOUNTER — Encounter: Payer: Self-pay | Admitting: *Deleted

## 2022-03-07 ENCOUNTER — Other Ambulatory Visit: Payer: Self-pay

## 2022-03-07 ENCOUNTER — Ambulatory Visit
Admission: RE | Admit: 2022-03-07 | Discharge: 2022-03-07 | Disposition: A | Discharge status: Home / Self Care | Payer: 59 | Attending: Gastroenterology | Admitting: Gastroenterology

## 2022-03-07 ENCOUNTER — Encounter
Admission: RE | Disposition: A | Discharge status: Home / Self Care | Payer: Self-pay | Source: Home / Self Care | Attending: Gastroenterology

## 2022-03-07 ENCOUNTER — Encounter: Payer: Self-pay | Admitting: *Deleted

## 2022-03-07 ENCOUNTER — Ambulatory Visit: Payer: 59 | Admitting: Anesthesiology

## 2022-03-07 DIAGNOSIS — E119 Type 2 diabetes mellitus without complications: Secondary | ICD-10-CM | POA: Diagnosis not present

## 2022-03-07 DIAGNOSIS — Z79899 Other long term (current) drug therapy: Secondary | ICD-10-CM | POA: Insufficient documentation

## 2022-03-07 DIAGNOSIS — Z1211 Encounter for screening for malignant neoplasm of colon: Secondary | ICD-10-CM | POA: Insufficient documentation

## 2022-03-07 DIAGNOSIS — Q438 Other specified congenital malformations of intestine: Secondary | ICD-10-CM | POA: Insufficient documentation

## 2022-03-07 DIAGNOSIS — K640 First degree hemorrhoids: Secondary | ICD-10-CM | POA: Insufficient documentation

## 2022-03-07 DIAGNOSIS — K5150 Left sided colitis without complications: Secondary | ICD-10-CM | POA: Diagnosis not present

## 2022-03-07 DIAGNOSIS — K5730 Diverticulosis of large intestine without perforation or abscess without bleeding: Secondary | ICD-10-CM | POA: Insufficient documentation

## 2022-03-07 DIAGNOSIS — Z7984 Long term (current) use of oral hypoglycemic drugs: Secondary | ICD-10-CM | POA: Diagnosis not present

## 2022-03-07 DIAGNOSIS — K219 Gastro-esophageal reflux disease without esophagitis: Secondary | ICD-10-CM | POA: Diagnosis not present

## 2022-03-07 HISTORY — PX: COLONOSCOPY WITH PROPOFOL: SHX5780

## 2022-03-07 HISTORY — PX: ESOPHAGOGASTRODUODENOSCOPY (EGD) WITH PROPOFOL: SHX5813

## 2022-03-07 LAB — GLUCOSE, CAPILLARY: Glucose-Capillary: 151 mg/dL — ABNORMAL HIGH (ref 70–99)

## 2022-03-07 SURGERY — COLONOSCOPY WITH PROPOFOL
Anesthesia: General

## 2022-03-07 MED ORDER — LIDOCAINE HCL (PF) 2 % IJ SOLN
INTRAMUSCULAR | Status: AC
  Filled 2022-03-07: qty 5

## 2022-03-07 MED ORDER — PROPOFOL 1000 MG/100ML IV EMUL
INTRAVENOUS | Status: AC
  Filled 2022-03-07: qty 100

## 2022-03-07 MED ORDER — PROPOFOL 500 MG/50ML IV EMUL
INTRAVENOUS | Status: DC | PRN
  Administered 2022-03-07: 150 ug/kg/min via INTRAVENOUS

## 2022-03-07 MED ORDER — SODIUM CHLORIDE 0.9 % IV SOLN: INTRAVENOUS | Status: DC

## 2022-03-07 MED ORDER — LIDOCAINE HCL (CARDIAC) PF 100 MG/5ML IV SOSY
PREFILLED_SYRINGE | INTRAVENOUS | Status: DC | PRN
  Administered 2022-03-07: 80 mg via INTRAVENOUS

## 2022-03-07 NOTE — Anesthesia Preprocedure Evaluation (Signed)
Anesthesia Evaluation  Patient identified by MRN, date of birth, ID band Patient awake    Reviewed: Allergy & Precautions, NPO status , Patient's Chart, lab work & pertinent test results  Airway Mallampati: III  TM Distance: <3 FB Neck ROM: full    Dental  (+) Chipped   Pulmonary neg pulmonary ROS, neg shortness of breath,    Pulmonary exam normal        Cardiovascular Exercise Tolerance: Good (-) angina(-) Past MI negative cardio ROS Normal cardiovascular exam     Neuro/Psych negative neurological ROS  negative psych ROS   GI/Hepatic Neg liver ROS, PUD, GERD  Controlled,  Endo/Other  diabetes, Type 2  Renal/GU negative Renal ROS  negative genitourinary   Musculoskeletal   Abdominal   Peds  Hematology negative hematology ROS (+)   Anesthesia Other Findings Past Medical History: No date: Diabetes mellitus without complication (HCC) No date: GERD (gastroesophageal reflux disease)  Past Surgical History: 01/17/2022: COLONOSCOPY WITH PROPOFOL; N/A     Comment:  Procedure: COLONOSCOPY WITH PROPOFOL;  Surgeon:               Regis Bill, MD;  Location: [facility] ENDOSCOPY;                Service: Endoscopy;  Laterality: N/A;  DM  BMI    Body Mass Index: 31.32 kg/m      Reproductive/Obstetrics negative OB ROS                             Anesthesia Physical Anesthesia Plan  ASA: 3  Anesthesia Plan: General   Post-op Pain Management:    Induction: Intravenous  PONV Risk Score and Plan: Propofol infusion and TIVA  Airway Management Planned: Natural Airway and Nasal Cannula  Additional Equipment:   Intra-op Plan:   Post-operative Plan:   Informed Consent: I have reviewed the patients History and Physical, chart, labs and discussed the procedure including the risks, benefits and alternatives for the proposed anesthesia with the patient or authorized representative who has  indicated his/her understanding and acceptance.     Dental Advisory Given  Plan Discussed with: Anesthesiologist, CRNA and Surgeon  Anesthesia Plan Comments: (Patient consented for risks of anesthesia including but not limited to:  - adverse reactions to medications - risk of airway placement if required - damage to eyes, teeth, lips or other oral mucosa - nerve damage due to positioning  - sore throat or hoarseness - Damage to heart, brain, nerves, lungs, other parts of body or loss of life  Patient voiced understanding.)        Anesthesia Quick Evaluation

## 2022-03-07 NOTE — Anesthesia Postprocedure Evaluation (Signed)
Anesthesia Post Note  Patient: GODSON POLLAN  Procedure(s) Performed: COLONOSCOPY WITH PROPOFOL ESOPHAGOGASTRODUODENOSCOPY (EGD) WITH PROPOFOL  Patient location during evaluation: Endoscopy Anesthesia Type: General Level of consciousness: awake and alert Pain management: pain level controlled Vital Signs Assessment: post-procedure vital signs reviewed and stable Respiratory status: spontaneous breathing, nonlabored ventilation, respiratory function stable and patient connected to nasal cannula oxygen Cardiovascular status: blood pressure returned to baseline and stable Postop Assessment: no apparent nausea or vomiting Anesthetic complications: no   No notable events documented.   Last Vitals:  Vitals:   03/07/22 1147 03/07/22 1157  BP: (!) 135/102 132/78  Pulse: 63 (!) 58  Resp: 17 17  Temp:    SpO2: 97% 100%    Last Pain:  Vitals:   03/07/22 1157  TempSrc:   PainSc: 0-No pain                 Cleda Mccreedy Lilja Soland

## 2022-03-07 NOTE — Interval H&P Note (Signed)
History and Physical Interval Note:  03/07/2022 10:41 AM  Larry Lloyd  has presented today for surgery, with the diagnosis of GERD UC.  The various methods of treatment have been discussed with the patient and family. After consideration of risks, benefits and other options for treatment, the patient has consented to  Procedure(s): COLONOSCOPY WITH PROPOFOL (N/A) ESOPHAGOGASTRODUODENOSCOPY (EGD) WITH PROPOFOL (N/A) as a surgical intervention.  The patient's history has been reviewed, patient examined, no change in status, stable for surgery.  I have reviewed the patient's chart and labs.  Questions were answered to the patient's satisfaction.     Regis Bill  Ok to proceed with EGD/Colonoscopy

## 2022-03-07 NOTE — Op Note (Signed)
[facility] Patient Name: Larry Lloyd Procedure Date: 03/07/2022 10:28 AM MRN: [id number] Account #: [id number] Date of Birth: 1960/12/17 Admit Type: Outpatient Age: 61 Room: [facility] ENDO ROOM 3 Gender: Male Note Status: Finalized Instrument Name: Jasper Riling [id number] Procedure:             Colonoscopy Indications:           High risk colon cancer surveillance: Ulcerative left                         sided colitis of 8 (or more) years duration Providers:             Andrey Farmer MD, MD Referring MD:          Rusty Aus, MD (Referring MD) Medicines:             Monitored Anesthesia Care Complications:         No immediate complications. Estimated blood loss:                         Minimal. Procedure:             Pre-Anesthesia Assessment:                        - Prior to the procedure, a History and Physical was                         performed, and patient medications and allergies were                         reviewed. The patient is competent. The risks and                         benefits of the procedure and the sedation options and                         risks were discussed with the patient. All questions                         were answered and informed consent was obtained.                         Patient identification and proposed procedure were                         verified by the physician, the nurse, the                         anesthesiologist, the anesthetist and the technician                         in the endoscopy suite. Mental Status Examination:                         alert and oriented. Airway Examination: normal                         oropharyngeal airway and neck mobility. Respiratory  Examination: clear to auscultation. CV Examination:                         normal. Prophylactic Antibiotics: The patient does not                         require prophylactic antibiotics. Prior                          Anticoagulants: The patient has taken no previous                         anticoagulant or antiplatelet agents. ASA Grade                         Assessment: II - A patient with mild systemic disease.                         After reviewing the risks and benefits, the patient                         was deemed in satisfactory condition to undergo the                         procedure. The anesthesia plan was to use monitored                         anesthesia care (MAC). Immediately prior to                         administration of medications, the patient was                         re-assessed for adequacy to receive sedatives. The                         heart rate, respiratory rate, oxygen saturations,                         blood pressure, adequacy of pulmonary ventilation, and                         response to care were monitored throughout the                         procedure. The physical status of the patient was                         re-assessed after the procedure.                        After obtaining informed consent, the colonoscope was                         passed under direct vision. Throughout the procedure,                         the patient's blood pressure, pulse, and oxygen  saturations were monitored continuously. The                         Colonoscope was introduced through the anus and                         advanced to the the cecum, identified by appendiceal                         orifice and ileocecal valve. The colonoscopy was                         technically difficult and complex due to a redundant                         colon and significant looping. Successful completion                         of the procedure was aided by applying abdominal                         pressure. The patient tolerated the procedure well.                         The quality of the bowel preparation was good. Findings:       The perianal and digital rectal examinations were normal.      Normal mucosa was found in the entire colon. Biopsies were taken with a       cold forceps for histology. Estimated blood loss was minimal.      Multiple small-mouthed diverticula were found in the sigmoid colon,       descending colon and ascending colon.      Internal hemorrhoids were found during retroflexion. The hemorrhoids       were Grade I (internal hemorrhoids that do not prolapse).      The exam was otherwise without abnormality on direct and retroflexion       views. Impression:            - Normal mucosa in the entire examined colon. Biopsied.                        - Diverticulosis in the sigmoid colon, in the                         descending colon and in the ascending colon.                        - Internal hemorrhoids.                        - The examination was otherwise normal on direct and                         retroflexion views. Recommendation:        - Discharge patient to home.                        - Resume previous diet.                        -  Continue present medications.                        - Await pathology results.                        - Repeat colonoscopy in 3 years for surveillance.                        - I would perform a CT colonography in 6-12 months as                         well to look specifically at cecum as only a brief                         view was obtained due to significant looping. Procedure Code(s):     --- Professional ---                        [phone], Colonoscopy, flexible; with biopsy, single or                         multiple Diagnosis Code(s):     --- Professional ---                        K51.50, Left sided colitis without complications                        K64.0, First degree hemorrhoids                        K57.30, Diverticulosis of large intestine without                         perforation or abscess without bleeding CPT copyright 2019 American  Medical Association. All rights reserved. The codes documented in this report are preliminary and upon coder review may  be revised to meet current compliance requirements. Andrey Farmer MD, MD 03/07/2022 11:44:29 AM Number of Addenda: 0 Note Initiated On: 03/07/2022 10:28 AM Scope Withdrawal Time: 0 hours 9 minutes 16 seconds  Total Procedure Duration: 0 hours 38 minutes 15 seconds  Estimated Blood Loss:  Estimated blood loss was minimal.      [facility]

## 2022-03-07 NOTE — Op Note (Signed)
[facility] Patient Name: Larry Lloyd Procedure Date: 03/07/2022 10:29 AM MRN: [id number] Account #: [id number] Date of Birth: 1960-12-17 Admit Type: Outpatient Age: 61 Room: [facility] ENDO ROOM 3 Gender: Male Note Status: Finalized Instrument Name: Upper Endoscope [id number] Procedure:             Upper GI endoscopy Indications:           Gastro-esophageal reflux disease Providers:             Andrey Farmer MD, MD Referring MD:          Rusty Aus, MD (Referring MD) Medicines:             Monitored Anesthesia Care Complications:         No immediate complications. Procedure:             Pre-Anesthesia Assessment:                        - Prior to the procedure, a History and Physical was                         performed, and patient medications and allergies were                         reviewed. The patient is competent. The risks and                         benefits of the procedure and the sedation options and                         risks were discussed with the patient. All questions                         were answered and informed consent was obtained.                         Patient identification and proposed procedure were                         verified by the physician, the nurse, the                         anesthesiologist, the anesthetist and the technician                         in the endoscopy suite. Mental Status Examination:                         alert and oriented. Airway Examination: normal                         oropharyngeal airway and neck mobility. Respiratory                         Examination: clear to auscultation. CV Examination:                         normal. Prophylactic Antibiotics: The patient does not  require prophylactic antibiotics. Prior                         Anticoagulants: The patient has taken no previous                         anticoagulant or antiplatelet agents. ASA  Grade                         Assessment: II - A patient with mild systemic disease.                         After reviewing the risks and benefits, the patient                         was deemed in satisfactory condition to undergo the                         procedure. The anesthesia plan was to use monitored                         anesthesia care (MAC). Immediately prior to                         administration of medications, the patient was                         re-assessed for adequacy to receive sedatives. The                         heart rate, respiratory rate, oxygen saturations,                         blood pressure, adequacy of pulmonary ventilation, and                         response to care were monitored throughout the                         procedure. The physical status of the patient was                         re-assessed after the procedure.                        After obtaining informed consent, the endoscope was                         passed under direct vision. Throughout the procedure,                         the patient's blood pressure, pulse, and oxygen                         saturations were monitored continuously. The Endoscope                         was introduced through the mouth, and advanced to the  second part of duodenum. The upper GI endoscopy was                         accomplished without difficulty. The patient tolerated                         the procedure well. Findings:      The examined esophagus was normal.      The entire examined stomach was normal.      The examined duodenum was normal. Impression:            - Normal esophagus.                        - Normal stomach.                        - Normal examined duodenum.                        - No specimens collected. Recommendation:        - Perform a colonoscopy today. Procedure Code(s):     --- Professional ---                        [phone],  Esophagogastroduodenoscopy, flexible,                         transoral; diagnostic, including collection of                         specimen(s) by brushing or washing, when performed                         (separate procedure) Diagnosis Code(s):     --- Professional ---                        K21.9, Gastro-esophageal reflux disease without                         esophagitis CPT copyright 2019 American Medical Association. All rights reserved. The codes documented in this report are preliminary and upon coder review may  be revised to meet current compliance requirements. Andrey Farmer MD, MD 03/07/2022 11:40:44 AM Number of Addenda: 0 Note Initiated On: 03/07/2022 10:29 AM Estimated Blood Loss:  Estimated blood loss: none.      [facility]

## 2022-03-07 NOTE — Anesthesia Procedure Notes (Signed)
Date/Time: 03/07/2022 10:41 AM  Performed by: Wayna Chalet, CindyPre-anesthesia Checklist: Patient identified, Emergency Drugs available, Suction available, Patient being monitored and Timeout performed Patient Re-evaluated:Patient Re-evaluated prior to induction Oxygen Delivery Method: Nasal cannula Preoxygenation: Pre-oxygenation with 100% oxygen Induction Type: IV induction Airway Equipment and Method: Bite block Placement Confirmation: positive ETCO2 and CO2 detector

## 2022-03-07 NOTE — Transfer of Care (Signed)
Immediate Anesthesia Transfer of Care Note  Patient: DONAVYN FECHER  Procedure(s) Performed: COLONOSCOPY WITH PROPOFOL ESOPHAGOGASTRODUODENOSCOPY (EGD) WITH PROPOFOL  Patient Location: PACU  Anesthesia Type:General  Level of Consciousness: awake and sedated  Airway & Oxygen Therapy: Patient Spontanous Breathing and Patient connected to nasal cannula oxygen  Post-op Assessment: Report given to RN and Post -op Vital signs reviewed and stable  Post vital signs: Reviewed and stable  Last Vitals:  Vitals Value Taken Time  BP    Temp    Pulse    Resp    SpO2      Last Pain:  Vitals:   03/07/22 0945  TempSrc: Temporal  PainSc: 0-No pain         Complications: No notable events documented.

## 2022-03-07 NOTE — H&P (Signed)
Outpatient short stay form Pre-procedure 03/07/2022  Regis Bill, MD  Primary Physician: Danella Penton, MD  Reason for visit:  GERD/UC surveillance  History of present illness:    61 y/o gentleman with left sided UC here for EGD/Colonoscopy for GERD/UC surveillance. No blood thinners. No family history of GI malignancies. No significant abdominal surgeries.    Current Facility-Administered Medications:    0.9 %  sodium chloride infusion, , Intravenous, Continuous, Pantera Winterrowd, Rossie Muskrat, MD, Last Rate: 20 mL/hr at 03/07/22 1004, New Bag at 03/07/22 1004  Medications Prior to Admission  Medication Sig Dispense Refill Last Dose   omeprazole (PRILOSEC) 40 MG capsule Take 40 mg by mouth daily.   03/05/2022   amoxicillin-clavulanate (AUGMENTIN) 875-125 MG tablet Take 1 tablet by mouth every 12 (twelve) hours. (Patient not taking: Reported on 01/17/2022) 14 tablet 0 Not Taking   balsalazide (COLAZAL) 750 MG capsule TAKE 3 CAPSULES (2,250 MG TOTAL) BY MOUTH 2 (TWO) TIMES DAILY.  1 03/05/2022   metFORMIN (GLUCOPHAGE) 500 MG tablet Take by mouth.   03/05/2022     No Known Allergies   Past Medical History:  Diagnosis Date   Diabetes mellitus without complication (HCC)    GERD (gastroesophageal reflux disease)     Review of systems:  Otherwise negative.    Physical Exam  Gen: Alert, oriented. Appears stated age.  HEENT: PERRLA. Lungs: No respiratory distress CV: RRR Abd: soft, benign, no masses Ext: No edema    Planned procedures: Proceed with EGD/colonoscopy. The patient understands the nature of the planned procedure, indications, risks, alternatives and potential complications including but not limited to bleeding, infection, perforation, damage to internal organs and possible oversedation/side effects from anesthesia. The patient agrees and gives consent to proceed.  Please refer to procedure notes for findings, recommendations and patient disposition/instructions.      Regis Bill, MD [facility]

## 2022-03-08 ENCOUNTER — Encounter: Payer: Self-pay | Admitting: Gastroenterology

## 2022-03-08 LAB — SURGICAL PATHOLOGY

## 2023-03-23 ENCOUNTER — Encounter: Payer: Self-pay | Admitting: Podiatry

## 2023-03-23 ENCOUNTER — Other Ambulatory Visit: Payer: Self-pay | Admitting: Podiatry

## 2023-03-23 DIAGNOSIS — G8929 Other chronic pain: Secondary | ICD-10-CM

## 2023-03-23 DIAGNOSIS — E119 Type 2 diabetes mellitus without complications: Secondary | ICD-10-CM

## 2023-03-23 DIAGNOSIS — M2022 Hallux rigidus, left foot: Secondary | ICD-10-CM

## 2023-03-23 DIAGNOSIS — M19072 Primary osteoarthritis, left ankle and foot: Secondary | ICD-10-CM

## 2023-03-24 ENCOUNTER — Encounter: Payer: Self-pay | Admitting: Podiatry

## 2023-03-28 ENCOUNTER — Other Ambulatory Visit: Payer: 59

## 2023-03-30 ENCOUNTER — Ambulatory Visit
Admission: RE | Admit: 2023-03-30 | Discharge: 2023-03-30 | Disposition: A | Discharge status: Home / Self Care | Payer: 59 | Source: Ambulatory Visit | Attending: Podiatry | Admitting: Podiatry

## 2023-03-30 DIAGNOSIS — G8929 Other chronic pain: Secondary | ICD-10-CM

## 2023-03-30 DIAGNOSIS — E119 Type 2 diabetes mellitus without complications: Secondary | ICD-10-CM

## 2023-03-30 DIAGNOSIS — M19072 Primary osteoarthritis, left ankle and foot: Secondary | ICD-10-CM

## 2023-03-30 DIAGNOSIS — M2022 Hallux rigidus, left foot: Secondary | ICD-10-CM

## 2023-04-27 ENCOUNTER — Other Ambulatory Visit: Payer: Self-pay | Admitting: Podiatry

## 2023-05-03 ENCOUNTER — Encounter: Payer: Self-pay | Admitting: Podiatry

## 2023-05-04 ENCOUNTER — Encounter: Payer: Self-pay | Admitting: Podiatry

## 2023-05-05 NOTE — Discharge Instructions (Signed)
[facility] SURGERY CENTER  POST OPERATIVE INSTRUCTIONS FOR DR. Ether Griffins AND DR. BAKER [facility] CLINIC PODIATRY DEPARTMENT   Take your medication as prescribed.  Pain medication should be taken only as needed.  Keep the dressing clean, dry and intact.  Keep your foot elevated above the heart level for the first 48 hours.  Walking to the bathroom and brief periods of walking are acceptable, unless we have instructed you to be non-weight bearing.  Always wear your post-op shoe when walking.  Always use your crutches if you are to be non-weight bearing.  Do not take a shower. Baths are permissible as long as the foot is kept out of the water.   Every hour you are awake:  Bend your knee 15 times. Flex foot 15 times Massage calf 15 times  Call [facility] [phone]) if any of the following problems occur: You develop a temperature or fever. The bandage becomes saturated with blood. Medication does not stop your pain. Injury of the foot occurs. Any symptoms of infection including redness, odor, or red streaks running from wound.

## 2023-05-11 ENCOUNTER — Encounter
Admission: RE | Disposition: A | Discharge status: Home / Self Care | Payer: Self-pay | Source: Home / Self Care | Attending: Podiatry

## 2023-05-11 ENCOUNTER — Ambulatory Visit: Payer: Self-pay

## 2023-05-11 ENCOUNTER — Ambulatory Visit: Payer: 59 | Admitting: Anesthesiology

## 2023-05-11 ENCOUNTER — Ambulatory Visit: Payer: 59

## 2023-05-11 ENCOUNTER — Other Ambulatory Visit: Payer: Self-pay

## 2023-05-11 ENCOUNTER — Ambulatory Visit
Admission: RE | Admit: 2023-05-11 | Discharge: 2023-05-11 | Disposition: A | Discharge status: Home / Self Care | Payer: 59 | Attending: Podiatry | Admitting: Podiatry

## 2023-05-11 DIAGNOSIS — E1142 Type 2 diabetes mellitus with diabetic polyneuropathy: Secondary | ICD-10-CM | POA: Insufficient documentation

## 2023-05-11 DIAGNOSIS — M19072 Primary osteoarthritis, left ankle and foot: Secondary | ICD-10-CM | POA: Diagnosis present

## 2023-05-11 DIAGNOSIS — E119 Type 2 diabetes mellitus without complications: Secondary | ICD-10-CM

## 2023-05-11 DIAGNOSIS — K219 Gastro-esophageal reflux disease without esophagitis: Secondary | ICD-10-CM | POA: Diagnosis not present

## 2023-05-11 HISTORY — PX: ARTHRODESIS METATARSAL: SHX6565

## 2023-05-11 HISTORY — PX: FOOT ARTHRODESIS: SHX1655

## 2023-05-11 HISTORY — PX: BONE BIOPSY: SHX375

## 2023-05-11 HISTORY — DX: Ulcerative colitis, unspecified, without complications: K51.90

## 2023-05-11 LAB — GLUCOSE, CAPILLARY: Glucose-Capillary: 139 mg/dL — ABNORMAL HIGH (ref 70–99)

## 2023-05-11 SURGERY — FUSION, JOINT, FOOT
Anesthesia: Regional | Site: Toe | Laterality: Left

## 2023-05-11 MED ORDER — BUPIVACAINE LIPOSOME 1.3 % IJ SUSP
INTRAMUSCULAR | Status: DC | PRN
  Administered 2023-05-11: 20 mL

## 2023-05-11 MED ORDER — BUPIVACAINE HCL (PF) 0.5 % IJ SOLN
INTRAMUSCULAR | Status: DC | PRN
  Administered 2023-05-11: 10 mL

## 2023-05-11 MED ORDER — AMOXICILLIN-POT CLAVULANATE 875-125 MG PO TABS
1.0000 | ORAL_TABLET | Freq: Two times a day (BID) | ORAL | 0 refills | Status: AC
Start: 2023-05-11 — End: ?

## 2023-05-11 MED ORDER — FENTANYL CITRATE (PF) 100 MCG/2ML IJ SOLN
INTRAMUSCULAR | Status: AC
  Filled 2023-05-11: qty 2

## 2023-05-11 MED ORDER — PROPOFOL 1000 MG/100ML IV EMUL
INTRAVENOUS | Status: AC
  Filled 2023-05-11: qty 100

## 2023-05-11 MED ORDER — MIDAZOLAM HCL 2 MG/2ML IJ SOLN
INTRAMUSCULAR | Status: AC
  Filled 2023-05-11: qty 2

## 2023-05-11 MED ORDER — ROCURONIUM BROMIDE 10 MG/ML (PF) SYRINGE
PREFILLED_SYRINGE | INTRAVENOUS | Status: AC
  Filled 2023-05-11: qty 10

## 2023-05-11 MED ORDER — FENTANYL CITRATE PF 50 MCG/ML IJ SOSY: 25.0000 ug | PREFILLED_SYRINGE | INTRAMUSCULAR | Status: DC | PRN

## 2023-05-11 MED ORDER — CEFAZOLIN SODIUM-DEXTROSE 2-4 GM/100ML-% IV SOLN
2.0000 g | INTRAVENOUS | Status: AC
  Administered 2023-05-11: 2 g via INTRAVENOUS

## 2023-05-11 MED ORDER — LACTATED RINGERS IV SOLN: INTRAVENOUS | Status: DC | PRN

## 2023-05-11 MED ORDER — HYDROMORPHONE HCL 1 MG/ML IJ SOLN: 0.5000 mg | INTRAMUSCULAR | Status: DC | PRN

## 2023-05-11 MED ORDER — DEXAMETHASONE SODIUM PHOSPHATE 4 MG/ML IJ SOLN
INTRAMUSCULAR | Status: AC
  Filled 2023-05-11: qty 1

## 2023-05-11 MED ORDER — LIDOCAINE HCL (PF) 2 % IJ SOLN
INTRAMUSCULAR | Status: AC
  Filled 2023-05-11: qty 5

## 2023-05-11 MED ORDER — ONDANSETRON HCL 4 MG/2ML IJ SOLN
INTRAMUSCULAR | Status: AC
  Filled 2023-05-11: qty 2

## 2023-05-11 MED ORDER — ONDANSETRON HCL 4 MG/2ML IJ SOLN
INTRAMUSCULAR | Status: DC | PRN
  Administered 2023-05-11: 4 mg via INTRAVENOUS

## 2023-05-11 MED ORDER — PROPOFOL 10 MG/ML IV BOLUS
INTRAVENOUS | Status: DC | PRN
  Administered 2023-05-11: 150 mg via INTRAVENOUS

## 2023-05-11 MED ORDER — OXYCODONE HCL 5 MG/5ML PO SOLN: 5.0000 mg | Freq: Once | ORAL | Status: DC | PRN

## 2023-05-11 MED ORDER — FENTANYL CITRATE PF 50 MCG/ML IJ SOSY
50.0000 ug | PREFILLED_SYRINGE | Freq: Once | INTRAMUSCULAR | Status: AC
  Administered 2023-05-11: 50 ug via INTRAVENOUS

## 2023-05-11 MED ORDER — EPHEDRINE SULFATE-NACL 50-0.9 MG/10ML-% IV SOSY
PREFILLED_SYRINGE | INTRAVENOUS | Status: DC | PRN
  Administered 2023-05-11: 10 mg via INTRAVENOUS

## 2023-05-11 MED ORDER — MIDAZOLAM HCL 5 MG/5ML IJ SOLN
INTRAMUSCULAR | Status: DC | PRN
  Administered 2023-05-11: 2 mg via INTRAVENOUS

## 2023-05-11 MED ORDER — OXYCODONE HCL 5 MG PO TABS
ORAL_TABLET | ORAL | Status: AC
  Filled 2023-05-11: qty 1

## 2023-05-11 MED ORDER — ASPIRIN 81 MG PO TBEC
81.0000 mg | DELAYED_RELEASE_TABLET | Freq: Two times a day (BID) | ORAL | 0 refills | Status: AC
Start: 2023-05-12 — End: 2023-06-23

## 2023-05-11 MED ORDER — FENTANYL CITRATE (PF) 100 MCG/2ML IJ SOLN
INTRAMUSCULAR | Status: DC | PRN
  Administered 2023-05-11 (×2): 50 ug via INTRAVENOUS

## 2023-05-11 MED ORDER — EPHEDRINE 5 MG/ML INJ
INTRAVENOUS | Status: AC
  Filled 2023-05-11: qty 5

## 2023-05-11 MED ORDER — OXYCODONE HCL 5 MG PO TABS: 5.0000 mg | ORAL_TABLET | Freq: Once | ORAL | Status: DC | PRN

## 2023-05-11 MED ORDER — DEXAMETHASONE SODIUM PHOSPHATE 4 MG/ML IJ SOLN
INTRAMUSCULAR | Status: DC | PRN
  Administered 2023-05-11: 4 mg via INTRAVENOUS

## 2023-05-11 MED ORDER — CEFAZOLIN SODIUM-DEXTROSE 2-3 GM-%(50ML) IV SOLR
INTRAVENOUS | Status: AC
  Filled 2023-05-11: qty 50

## 2023-05-11 MED ORDER — HEPARIN SODIUM (PORCINE) 5000 UNIT/ML IJ SOLN
INTRAMUSCULAR | Status: DC | PRN
Start: 2023-05-11 — End: 2023-05-11
  Administered 2023-05-11: 5000 [IU] via SUBCUTANEOUS

## 2023-05-11 MED ORDER — BUPIVACAINE LIPOSOME 1.3 % IJ SUSP
INTRAMUSCULAR | Status: AC
  Filled 2023-05-11: qty 20

## 2023-05-11 MED ORDER — ONDANSETRON HCL 4 MG PO TABS: 4.0000 mg | ORAL_TABLET | Freq: Three times a day (TID) | ORAL | 0 refills | Status: AC | PRN

## 2023-05-11 MED ORDER — 0.9 % SODIUM CHLORIDE (POUR BTL) OPTIME
TOPICAL | Status: DC | PRN
  Administered 2023-05-11: 250 mL

## 2023-05-11 MED ORDER — PROPOFOL 10 MG/ML IV BOLUS
INTRAVENOUS | Status: AC
Start: 2023-05-11 — End: ?
  Filled 2023-05-11: qty 40

## 2023-05-11 MED ORDER — HEPARIN SODIUM (PORCINE) 5000 UNIT/ML IJ SOLN
INTRAMUSCULAR | Status: AC
  Filled 2023-05-11: qty 1

## 2023-05-11 MED ORDER — ROCURONIUM BROMIDE 100 MG/10ML IV SOLN
INTRAVENOUS | Status: DC | PRN
  Administered 2023-05-11: 70 mg via INTRAVENOUS

## 2023-05-11 MED ORDER — MIDAZOLAM HCL 2 MG/2ML IJ SOLN
2.0000 mg | Freq: Once | INTRAMUSCULAR | Status: AC
  Administered 2023-05-11: 2 mg via INTRAVENOUS

## 2023-05-11 MED ORDER — OXYCODONE-ACETAMINOPHEN 7.5-325 MG PO TABS: 1.0000 | ORAL_TABLET | ORAL | 0 refills | Status: AC | PRN

## 2023-05-11 SURGICAL SUPPLY — 80 items
10CC LOCKING SYRINGE IMPLANT
ALLOGRAFT BONE CORT DBM 5ML (Tissue) IMPLANT
BIT DRILL 2 FENESTRATED (MISCELLANEOUS) IMPLANT
BIT DRILL 2.8 (BIT) ×2
BIT DRILL CANN SURG 2.9X140 (DRILL) IMPLANT
BIT DRILL CANNULATED 4.6 (BIT) IMPLANT
BIT DRILL CNTRSNK MONSTER 4.5 (DRILL) IMPLANT
BIT DRILL CNTRSNK MONSTER 7.0 (DRILL) IMPLANT
BIT DRILL LNG 140X2.8XSLD (BIT) IMPLANT
BIT DRL LNG 140X2.8XSLD (BIT) ×2
BLADE SURG 15 STRL LF DISP TIS (BLADE) IMPLANT
BLADE SURG 15 STRL SS (BLADE) ×4
BNDG CMPR STD VLCR NS LF 5.8X4 (GAUZE/BANDAGES/DRESSINGS) ×2
BNDG CMPR STD VLCR NS LF 5.8X6 (GAUZE/BANDAGES/DRESSINGS) ×2
BNDG ELASTIC 4X5.8 VLCR NS LF (GAUZE/BANDAGES/DRESSINGS) ×2 IMPLANT
BNDG ELASTIC 6X5.8 VLCR NS LF (GAUZE/BANDAGES/DRESSINGS) ×2 IMPLANT
BNDG ESMARCH 4 X 12 STRL LF (GAUZE/BANDAGES/DRESSINGS) ×2
BNDG ESMARCH 4X12 STRL LF (GAUZE/BANDAGES/DRESSINGS) ×2 IMPLANT
BNDG GAUZE DERMACEA FLUFF 4 (GAUZE/BANDAGES/DRESSINGS) ×2 IMPLANT
BNDG GZE DERMACEA 4 6PLY (GAUZE/BANDAGES/DRESSINGS) ×2
BONE MARROW HARVEST NEEDLE IMPLANT
CANISTER SUCT 1200ML W/VALVE (MISCELLANEOUS) ×2 IMPLANT
COVER LIGHT HANDLE UNIVERSAL (MISCELLANEOUS) ×4 IMPLANT
DRAPE C-ARM XRAY 36X54 (DRAPES) IMPLANT
DRAPE C-ARMOR (DRAPES) IMPLANT
DRAPE FLUOR MINI C-ARM 54X84 (DRAPES) ×2 IMPLANT
DRILL CANN SURG 2.9X140 (DRILL) ×2
DRILL COUNTERSINK MONSTER 4.5 (DRILL) ×2
DRILL COUNTERSINK MONSTER 7.0 (DRILL) ×2
DURAPREP 26ML APPLICATOR (WOUND CARE) ×2 IMPLANT
ELECT REM PT RETURN 9FT ADLT (ELECTROSURGICAL) ×2
ELECTRODE REM PT RTRN 9FT ADLT (ELECTROSURGICAL) ×2 IMPLANT
GAUZE 4X4 16PLY ~~LOC~~+RFID DBL (SPONGE) IMPLANT
GAUZE SPONGE 4X4 12PLY STRL (GAUZE/BANDAGES/DRESSINGS) ×2 IMPLANT
GAUZE XEROFORM 1X8 LF (GAUZE/BANDAGES/DRESSINGS) ×2 IMPLANT
GLOVE BIOGEL PI IND STRL 7.5 (GLOVE) ×2 IMPLANT
GLOVE SURG SS PI 7.0 STRL IVOR (GLOVE) ×2 IMPLANT
GOWN STRL REUS W/ TWL LRG LVL3 (GOWN DISPOSABLE) ×4 IMPLANT
GOWN STRL REUS W/TWL LRG LVL3 (GOWN DISPOSABLE) ×8
K-WIRE FIX 1.4X150 (WIRE) ×4
K-WIRE SINGLE TROCAR 2.3X230 (WIRE) ×4
K-WIRE SMOOTH 1.6X150MM (WIRE) ×4
K-WIRE SMOOTH 2.3X150 SGL TIP (WIRE) ×4
KIT ASP BONE MRW 11G (KITS) IMPLANT
KIT TURNOVER KIT A (KITS) ×2 IMPLANT
KWIRE FIX 1.4X150 (WIRE) IMPLANT
KWIRE SINGLE TROCAR 2.3X230 (WIRE) IMPLANT
KWIRE SMOOTH 1.6X150MM (WIRE) IMPLANT
KWIRE SMOOTH 2.3X150 SGL TIP (WIRE) IMPLANT
NS IRRIG 500ML POUR BTL (IV SOLUTION) ×2 IMPLANT
PACK EXTREMITY ARMC (MISCELLANEOUS) ×2 IMPLANT
PAD ABD DERMACEA PRESS 5X9 (GAUZE/BANDAGES/DRESSINGS) IMPLANT
PADDING CAST BLEND 4X4 NS (MISCELLANEOUS) ×6 IMPLANT
PLATE TEDDY BEAR MEDIUM (Plate) IMPLANT
SCREW CANN HDLS 7.0X82 (Screw) IMPLANT
SCREW CANNULATED 4.0X36S (Screw) IMPLANT
SCREW HEADLESS CANN 7.0X86 SHT (Screw) IMPLANT
SCREW LOCK PLATE R3 4.2X18 (Screw) ×2 IMPLANT
SCREW LOCK PLATE R3 4.2X20 (Screw) IMPLANT
SCREW LOCK PLATE R3 4.2X24 (Screw) IMPLANT
SCREW LOCK PLATE R3 4.2X28 (Screw) ×2 IMPLANT
SCREW LOCK PLT 18X4.2X GRLL (Screw) IMPLANT
SCREW LOCK PLT 28X4.2XNS GRLL (Screw) IMPLANT
SPLINT CAST 1 STEP 4X30 (MISCELLANEOUS) ×2 IMPLANT
SPONGE T-LAP 18X18 ~~LOC~~+RFID (SPONGE) IMPLANT
STAPLER SKIN PROX 35W (STAPLE) IMPLANT
STOCKINETTE IMPERVIOUS LG (DRAPES) ×2 IMPLANT
SUT ETHILON 4-0 (SUTURE)
SUT ETHILON 4-0 FS2 18XMFL BLK (SUTURE)
SUT VIC AB 2-0 SH 27 (SUTURE)
SUT VIC AB 2-0 SH 27XBRD (SUTURE) IMPLANT
SUT VIC AB 3-0 SH 27 (SUTURE) ×8
SUT VIC AB 3-0 SH 27X BRD (SUTURE) IMPLANT
SUT VIC AB 4-0 FS2 27 (SUTURE) IMPLANT
SUT VIC AB 4-0 SH 27 (SUTURE) ×4
SUT VIC AB 4-0 SH 27XANBCTRL (SUTURE) IMPLANT
SUTURE ETHLN 4-0 FS2 18XMF BLK (SUTURE) IMPLANT
SYR 10CC LOCKING BMA NDL (SYRINGE) IMPLANT
SYR 10CC LOCKING BMA NEEDLE (SYRINGE) ×2 IMPLANT
WIRE OLIVE SMOOTH 1.4MMX60MM (WIRE) IMPLANT

## 2023-05-11 NOTE — H&P (Signed)
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  The various methods of treatment have been discussed with the patient.  No guarantees were given.  After consideration of risks, benefits and other options for treatment, the patient has consented to surgery.  I have reviewed the patients' chart and labs.    PROCEDURE: LEFT TALONAVICULAR JOINT FUSION LEFT SUBTALAR JOINT FUSION BONE MARROW ASPIRATE HARVEST FROM LEFT CALCANEUS  A history and physical examination was performed in my office.  The patient was reexamined.  There have been no changes to this history and physical examination.  Rosetta Posner, DPM

## 2023-05-11 NOTE — Transfer of Care (Signed)
Immediate Anesthesia Transfer of Care Note  Patient: Larry Lloyd  Procedure(s) Performed: ARTHRODESIS FOOT - SUBTALAR JOINT (Left: Foot) ARTHRODESIS METATARSAL - TALONAVICULAR JOINT (Left: Toe) 38220 - BMA; BONE MARROW; ASPIRATION ONLY (Left: Foot)  Patient Location: PACU  Anesthesia Type: General ETT, General, Regional  Level of Consciousness: awake, alert  and patient cooperative  Airway and Oxygen Therapy: Patient Spontanous Breathing and Patient connected to supplemental oxygen  Post-op Assessment: Post-op Vital signs reviewed, Patient's Cardiovascular Status Stable, Respiratory Function Stable, Patent Airway and No signs of Nausea or vomiting  Post-op Vital Signs: Reviewed and stable  Complications: No notable events documented.

## 2023-05-11 NOTE — Anesthesia Preprocedure Evaluation (Signed)
Anesthesia Evaluation  Patient identified by MRN, date of birth, ID band Patient awake    Reviewed: Allergy & Precautions, NPO status , Patient's Chart, lab work & pertinent test results  Airway Mallampati: III  TM Distance: <3 FB Neck ROM: full    Dental  (+) Chipped   Pulmonary neg pulmonary ROS, neg shortness of breath   Pulmonary exam normal        Cardiovascular Exercise Tolerance: Good (-) angina (-) Past MI negative cardio ROS Normal cardiovascular exam     Neuro/Psych negative neurological ROS  negative psych ROS   GI/Hepatic Neg liver ROS, PUD,GERD  Controlled,,  Endo/Other  diabetes, Type 2    Renal/GU      Musculoskeletal   Abdominal   Peds  Hematology negative hematology ROS (+)   Anesthesia Other Findings Past Medical History: No date: Diabetes mellitus without complication (HCC) No date: GERD (gastroesophageal reflux disease)  Past Surgical History: 01/17/2022: COLONOSCOPY WITH PROPOFOL; N/A     Comment:  Procedure: COLONOSCOPY WITH PROPOFOL;  Surgeon:               Regis Bill, MD;  Location: [facility] ENDOSCOPY;                Service: Endoscopy;  Laterality: N/A;  DM  BMI    Body Mass Index: 31.32 kg/m      Reproductive/Obstetrics negative OB ROS                             Anesthesia Physical Anesthesia Plan  ASA: 2  Anesthesia Plan: General ETT, General and Regional   Post-op Pain Management:    Induction: Intravenous  PONV Risk Score and Plan: 2 and Ondansetron, Dexamethasone and Midazolam  Airway Management Planned: Oral ETT  Additional Equipment:   Intra-op Plan:   Post-operative Plan: Extubation in OR  Informed Consent: I have reviewed the patients History and Physical, chart, labs and discussed the procedure including the risks, benefits and alternatives for the proposed anesthesia with the patient or authorized representative who has  indicated his/her understanding and acceptance.     Dental Advisory Given  Plan Discussed with: Anesthesiologist, CRNA and Surgeon  Anesthesia Plan Comments: (Patient consented for risks of anesthesia including but not limited to:  - adverse reactions to medications - damage to eyes, teeth, lips or other oral mucosa - nerve damage due to positioning  - sore throat or hoarseness - Damage to heart, brain, nerves, lungs, other parts of body or loss of life  Patient voiced understanding and assent.)       Anesthesia Quick Evaluation

## 2023-05-11 NOTE — Anesthesia Procedure Notes (Addendum)
Anesthesia Regional Block: Adductor canal block   Pre-Anesthetic Checklist: , timeout performed,  Correct Patient, Correct Site, Correct Laterality,  Correct Procedure, Correct Position, site marked,  Risks and benefits discussed,  Surgical consent,  Pre-op evaluation,  At surgeon's request and post-op pain management  Laterality: Lower and Left  Prep: chloraprep       Needles:  Injection technique: Single-shot  Needle Type: Echogenic Needle     Needle Length: 9cm  Needle Gauge: 21     Additional Needles:   Procedures:,,,, ultrasound used (permanent image in chart),,    Narrative:  Start time: 05/11/2023 7:25 AM End time: 05/11/2023 7:30 AM Injection made incrementally with aspirations every 5 mL.  Performed by: Personally  Anesthesiologist: Stephanie Coup, MD  Additional Notes: Patient's chart reviewed and they were deemed appropriate candidate for procedure, per surgeon's request. Patient educated about risks, benefits, and alternatives of the block including but not limited to: temporary or permanent nerve damage, bleeding, infection, damage to surround tissues, block failure, local anesthetic toxicity. Patient expressed understanding. A formal time-out was conducted consistent with institution rules.  Monitors were applied, and minimal sedation used (see nursing record). The site was prepped with skin prep and allowed to dry, and sterile gloves were used. A high frequency linear ultrasound probe with probe cover was utilized throughout. Femoral artery visualized at mid-thigh level, local anesthetic injected anterolateral to it, and echogenic block needle trajectory was monitored throughout. Hydrodissection of saphenous nerve visualized and appeared anatomically normal. Aspiration performed every 5ml. Blood vessels were avoided. All injections were performed without resistance and free of blood and paresthesias. The patient tolerated the procedure well. A picture of the nerve  block was added to the patient's chart.  Injectate: 10cc of exparel and 5cc of 0.5% bupivacaine

## 2023-05-11 NOTE — Anesthesia Postprocedure Evaluation (Signed)
Anesthesia Post Note  Patient: Larry Lloyd  Procedure(s) Performed: ARTHRODESIS FOOT - SUBTALAR JOINT (Left: Foot) ARTHRODESIS METATARSAL - TALONAVICULAR JOINT (Left: Toe) 38220 - BMA; BONE MARROW; ASPIRATION ONLY (Left: Foot)  Patient location during evaluation: PACU Anesthesia Type: Regional and General Level of consciousness: awake and alert Pain management: pain level controlled Vital Signs Assessment: post-procedure vital signs reviewed and stable Respiratory status: spontaneous breathing, nonlabored ventilation, respiratory function stable and patient connected to nasal cannula oxygen Cardiovascular status: stable and blood pressure returned to baseline Postop Assessment: no apparent nausea or vomiting Anesthetic complications: no  No notable events documented.   Last Vitals:  Vitals:   05/11/23 1130 05/11/23 1135  BP: (!) 131/58   Pulse: 85 84  Resp: 17 18  Temp:    SpO2: 93% 91%    Last Pain:  Vitals:   05/11/23 1130  TempSrc:   PainSc: 0-No pain                 Stephanie Coup

## 2023-05-11 NOTE — Anesthesia Procedure Notes (Signed)
Anesthesia Regional Block: Popliteal block   Pre-Anesthetic Checklist: , timeout performed,  Correct Patient, Correct Site, Correct Laterality,  Correct Procedure, Correct Position, site marked,  Risks and benefits discussed,  Surgical consent,  Pre-op evaluation,  At surgeon's request and post-op pain management  Laterality: Left  Prep: chloraprep       Needles:  Injection technique: Single-shot  Needle Type: Echogenic Needle     Needle Length: 9cm  Needle Gauge: 21     Additional Needles:   Procedures:,,,, ultrasound used (permanent image in chart),,    Narrative:  Start time: 05/11/2023 7:22 AM End time: 05/11/2023 7:25 AM Injection made incrementally with aspirations every 5 mL.  Performed by: Personally  Anesthesiologist: Stephanie Coup, MD  Additional Notes: Patient's chart reviewed and they were deemed appropriate candidate for procedure, at surgeon's request. Patient educated about risks, benefits, and alternatives of the block including but not limited to: temporary or permanent nerve damage, bleeding, infection, damage to surround tissues, block failure, local anesthetic toxicity. Patient expressed understanding. A formal time-out was conducted consistent with institution rules.  Monitors were applied, and minimal sedation used. The site was prepped with skin prep and allowed to dry, and sterile gloves were used. A high frequency linear ultrasound probe with probe cover was utilized throughout. Popliteal artery pulsatile and visualized in popliteal fossa along with adjacent sciatic nerve and its branch point, which appeared anatomically normal, local anesthetic injected around them just proximal to the branch point, and echogenic block needle trajectory was monitored throughout. Aspiration performed every 5ml. Blood vessels were avoided. All injections were performed without resistance and free of blood and paresthesias. The patient tolerated the procedure well. A picture  of the nerve block was added to the patient's chart.  Injectate: 10cc of exparel and 5cc of 0.5% bupivacaine

## 2023-05-11 NOTE — Op Note (Addendum)
PODIATRY / FOOT AND ANKLE SURGERY OPERATIVE REPORT    SURGEON: Rosetta Posner, DPM  PRE-OPERATIVE DIAGNOSIS:  1.  Severe left talonavicular joint arthritis  POST-OPERATIVE DIAGNOSIS: Same  PROCEDURE(S): Left bone marrow aspirate concentrate harvest from left heel Left talonavicular fusion Left subtalar joint fusion  HEMOSTASIS: Left thigh tourniquet  ANESTHESIA: general  ESTIMATED BLOOD LOSS: 80 cc  FINDING(S): 1.  Severe arthritis to the left talonavicular joint with cystic changes to the joint with minimal cartilage present  PATHOLOGY/SPECIMEN(S): None  INDICATIONS:   Larry Lloyd is a 62 y.o. male who presents with pain to the left rear foot area over the talonavicular joint.  Patient x-ray imaging showing severe arthritis to the area.  He also had a CT scan which revealed severe arthritis to the talonavicular joint with a large amount of cystic changes to the navicular and talus.  Patient had been treated conservatively with bracing and anti-inflammatory medications but still continued to have pain discomfort despite this.  All treatment options were discussed with the patient both conservative and surgical attempts at correction include potential risks and complications at this time patient is elected for surgery consisting of left bone marrow aspirate concentrate harvest from calcaneus, talonavicular fusion, subtalar joint fusion.  No guarantees given.  Consent obtained prior to procedure.  DESCRIPTION: After obtaining full informed written consent, the patient was brought back to the operating room and placed supine upon the operating table.  The patient received IV antibiotics prior to induction.  After obtaining adequate anesthesia, the patient was prepped and draped in the standard fashion.  Prior to surgery anesthesia performed a preoperative popliteal and saphenous nerve block.    Attention was directed to the left lateral wall the calcaneus.  At this time the Jamshidi  needle and cannula was introduced into the lateral wall the calcaneus into the calcaneal body.  The Jamshidi needle was removed leaving the cannula in place.  The syringe with heparin was attached to the cannula and used to exsanguinate bone marrow from the calcaneus.  Approximately 10 cc was harvested.  The Jamshidi needle was then placed back within the cannula and both instruments were removed.  The skin was reapproximated well coapted with 3-0 nylon.  Attention was then directed to the talonavicular joint area dorsally and was marked under fluoroscopic guidance.  An incision was made slightly lateral to the tendon of the tibialis anterior medial to the tendon of the extensor hallucis longus.  The incision was made over the talonavicular joint.  The incision was deepened to the subcutaneous tissues utilizing sharp blunt dissection care was taken to identify and retract all vital neurovascular structures and all venous contributories were cauterized as necessary.  At this time an incision was made over the extensor retinaculum.  The extensor retinaculum was divided and the tibialis anterior and extensor hallucis longus tendons were identified and retracted medially and laterally throughout the remainder the case.  Deep dissection was then continued down to the talonavicular joint capsule which was opened.  The capsular tissues were then reflected medially and laterally thereby exposing the entirety of the talonavicular joint.  The joint appeared to be severely diseased with a large amount of bony exostosis present at the dorsal aspect of the joint with a large amount of synovitic tissue present within the joint.  The joint distractor was then applied and the joint was distracted.  All remaining articular cartilage was removed but there appeared to be fairly minimal overall to the talonavicular joint to  healthy subchondral bone.  There did appear to be a large amount of cystic changes at the joint levels.  The  surgical site was flushed with copious amounts normal sterile saline.  There did not appear to be any further cartilage remaining to the area.  Fenestration was then performed to the talonavicular joint in its entirety with a 2.0 drill bit.  The bone marrow aspirate concentrate was then mixed with the V92 cancellous bone substitute.  This was then packed into the talonavicular joint and the cystic areas as well is filling the joint in its entirety.  This appeared to sit nicely.  The joint distractor was then removed and passed off the operative site.  Attention was then directed to the lateral aspect of the left foot where an incision was made from the distal fibula extending down to the calcaneocuboid joint area.  The incision was deepened through the subcutaneous tissues utilizing sharp and blunt dissection and care was taken to notify and retract all vital neurovascular structures and all venous contributories were cauterized as necessary.  At this time an incision was made into the deep fascia directly over the top of the extensor digitorum brevis muscle belly.  The fascia and muscle belly was reflected dorsally thereby exposing gaining access into the sinus tarsi and exposing the subtalar joint.  The peroneal tendons were also able to be visualized during this time and retracted inferiorly and posteriorly throughout the remainder of the case.  The interosseous talocalcaneal ligament was then dissected free and transected.  The joint was then able to be easily accessed.  An osteotome was then placed in the joint and opened up further.  The pin distractor was then applied.  There appeared to be fairly minimal articular cartilage damage to the subtalar joint.  The articular cartilage was resected from the subtalar joint in its entirety removing both the dorsal calcaneal cartilage and the plantar talar cartilage.  The joint was then flushed with copious amounts normal sterile saline ensuring that all cartilage in  the area was resected and appeared to be adequate.  At this time a 2.0 drill bit was then used to fenestrate the joint surface.  The joint was then packed with the remaining V92 bone substitute mixed with the bone marrow aspirate concentrate.  The joint distractor was then removed.  There appeared to be good bony apposition overall.  The foot was then held in a rectus position to slight planus.  At this time guidewires for 7.0 cannulated partially-threaded headless screws from [employer] were then placed across the calcaneus through the subtalar joint and into the talus with the appropriate orientation under fluoroscopic guidance.  This was checked under the lateral, axial calcaneal, and AP ankle views.  The guidewires appeared to sit in the appropriate position overall.  Small incisions were made around the pins that were at the posterior plantar heel area and blunt dissection was continued down to bone with a hemostat.  Utilizing standard AO principles and techniques to 7.0 cannulated partially-threaded headless screws were placed with appropriate orientation and appeared to be the appropriate size and length under fluoroscopic guidance.  Excellent compression was noted at the subtalar joint level.  The pneumatic thigh tourniquet was deflated and a prompt hyperemic response was noted all digits of the left foot.  Any bleeding vessels that were noted were cauterized yet further, hemostasis appeared to be well achieved.  Attention was then directed to the talonavicular joint level.  At this time temporary fixation  was obtained for 4.5 cannulated screw guidewire across the talonavicular joint from the navicular tuberosity across the navicular into the talus while holding the foot and talus in a reduced position.  This was checked under fluoroscopic guidance and appeared to have good apposition.  At this time a teddy bear plate from [employer] was placed at the dorsal aspect of the joint and checked under  fluoroscopic guidance and held with temporary fixation olive wires.  This appeared to sit in excellent position overall.  The distal 3 locking screws then placed that were all 4.2 utilizing standard AO principles and techniques.  The olive wires were removed.  The compression hole was then filled at the talar level with the appropriate orientation using the eccentric drilling technique.  A 4.2 mm nonlocking screw was placed.  Excellent compression was noted overall in good bite with screw purchase.  The 2 remaining proximal locking screws were placed that were 4.2 mm locking screws with the appropriate orientation utilizing standard AO principles and techniques.  Attention was then directed to the guidewire that was placed for the 4.5 cannulated screw that was going from the navicular tuberosity into the talus.  This appeared to sit well under fluoroscopic imaging.  A percutaneous incision was made around this wire and blunt dissection was continued down to bone.  Utilizing standard AO principles and techniques a 4.5 mm cannulated headed partially-threaded screw from [employer] was placed across the talonavicular joint to aid in further compression and stability of the joint.  This appeared to have good purchase and good bite as well.  There appeared to be some bleeding still from the foot at this time that appear to be a slow ooze but the Esmarch bandage used to exsanguinate the left lower extremity and the pneumatic thigh tourniquet was inflated.  The surgical sites were flushed with copious amounts normal sterile saline.  The deep fascia/capsular structures were reapproximated well coapted with 3-0 Vicryl.  The extensor retinaculum and muscle belly of the extensor digitorum brevis were reapproximated well coapted with 3-0 Vicryl as well as the tibialis anterior tendon sheath.  The subcutaneous tissue was reapproximated well coapted with a combination of 3-0 Vicryl and 4-0 Vicryl.  The skin was then  reapproximated well coapted with combination of 3-0 nylon and skin staples.  The pneumatic thigh tourniquet was deflated and a prompt hyperemic response was noted all distal left foot.  A postoperative dressing was then applied consisting of Xeroform followed by 4 x 4 gauze, ABD, gauze roll, Webril, posterior splint, Ace wrap.  The patient tolerated the procedure and anesthesia well and was transferred to recovery with vital signs stable and vascular status intact all toes left foot.  Following period of postoperative monitoring the patient be discharged home with the appropriate orders, instructions, and medications.  Patient is to remain nonweightbearing at all times left lower extremity and to follow-up in clinic within 1 week of surgical date.  COMPLICATIONS: None  CONDITION: Good, stable  Rosetta Posner, DPM

## 2023-05-11 NOTE — Progress Notes (Signed)
[facility] with left, popliteal/saphenous, ultrasound guided block. Side rails up, monitors on throughout procedure. See vital signs in flow sheet. Tolerated Procedure well.

## 2023-05-16 ENCOUNTER — Encounter: Payer: Self-pay | Admitting: Podiatry

## 2023-09-13 DIAGNOSIS — Z125 Encounter for screening for malignant neoplasm of prostate: Secondary | ICD-10-CM | POA: Diagnosis not present

## 2023-09-13 DIAGNOSIS — E119 Type 2 diabetes mellitus without complications: Secondary | ICD-10-CM | POA: Diagnosis not present

## 2023-09-13 DIAGNOSIS — K5130 Ulcerative (chronic) rectosigmoiditis without complications: Secondary | ICD-10-CM | POA: Diagnosis not present

## 2023-09-13 DIAGNOSIS — Z0000 Encounter for general adult medical examination without abnormal findings: Secondary | ICD-10-CM | POA: Diagnosis not present

## 2023-09-13 DIAGNOSIS — E538 Deficiency of other specified B group vitamins: Secondary | ICD-10-CM | POA: Diagnosis not present

## 2023-10-02 DIAGNOSIS — M19072 Primary osteoarthritis, left ankle and foot: Secondary | ICD-10-CM | POA: Diagnosis not present

## 2023-10-02 DIAGNOSIS — E119 Type 2 diabetes mellitus without complications: Secondary | ICD-10-CM | POA: Diagnosis not present

## 2023-10-02 DIAGNOSIS — M79672 Pain in left foot: Secondary | ICD-10-CM | POA: Diagnosis not present

## 2023-10-02 DIAGNOSIS — G8929 Other chronic pain: Secondary | ICD-10-CM | POA: Diagnosis not present

## 2023-10-20 DIAGNOSIS — K219 Gastro-esophageal reflux disease without esophagitis: Secondary | ICD-10-CM | POA: Diagnosis not present

## 2023-10-20 DIAGNOSIS — K5130 Ulcerative (chronic) rectosigmoiditis without complications: Secondary | ICD-10-CM | POA: Diagnosis not present

## 2023-11-10 ENCOUNTER — Other Ambulatory Visit: Payer: Self-pay | Admitting: Gastroenterology

## 2023-11-10 DIAGNOSIS — K5130 Ulcerative (chronic) rectosigmoiditis without complications: Secondary | ICD-10-CM

## 2023-11-22 DIAGNOSIS — E119 Type 2 diabetes mellitus without complications: Secondary | ICD-10-CM | POA: Diagnosis not present

## 2023-11-22 DIAGNOSIS — H5213 Myopia, bilateral: Secondary | ICD-10-CM | POA: Diagnosis not present

## 2024-01-24 ENCOUNTER — Ambulatory Visit
Admission: RE | Admit: 2024-01-24 | Discharge: 2024-01-24 | Disposition: A | Discharge status: Home / Self Care | Payer: Self-pay | Source: Ambulatory Visit | Attending: Gastroenterology

## 2024-01-24 DIAGNOSIS — K5130 Ulcerative (chronic) rectosigmoiditis without complications: Secondary | ICD-10-CM

## 2024-03-20 DIAGNOSIS — E119 Type 2 diabetes mellitus without complications: Secondary | ICD-10-CM | POA: Diagnosis not present

## 2024-03-20 DIAGNOSIS — E538 Deficiency of other specified B group vitamins: Secondary | ICD-10-CM | POA: Diagnosis not present

## 2024-03-20 DIAGNOSIS — Z125 Encounter for screening for malignant neoplasm of prostate: Secondary | ICD-10-CM | POA: Diagnosis not present

## 2024-03-27 DIAGNOSIS — E119 Type 2 diabetes mellitus without complications: Secondary | ICD-10-CM | POA: Diagnosis not present

## 2024-03-27 DIAGNOSIS — Z0000 Encounter for general adult medical examination without abnormal findings: Secondary | ICD-10-CM | POA: Diagnosis not present

## 2024-03-27 DIAGNOSIS — Z79899 Other long term (current) drug therapy: Secondary | ICD-10-CM | POA: Diagnosis not present

## 2024-03-27 DIAGNOSIS — E538 Deficiency of other specified B group vitamins: Secondary | ICD-10-CM | POA: Diagnosis not present

## 2024-04-19 DIAGNOSIS — D2261 Melanocytic nevi of right upper limb, including shoulder: Secondary | ICD-10-CM | POA: Diagnosis not present

## 2024-04-19 DIAGNOSIS — D485 Neoplasm of uncertain behavior of skin: Secondary | ICD-10-CM | POA: Diagnosis not present

## 2024-04-19 DIAGNOSIS — C44321 Squamous cell carcinoma of skin of nose: Secondary | ICD-10-CM | POA: Diagnosis not present

## 2024-04-19 DIAGNOSIS — D2262 Melanocytic nevi of left upper limb, including shoulder: Secondary | ICD-10-CM | POA: Diagnosis not present

## 2024-04-19 DIAGNOSIS — D225 Melanocytic nevi of trunk: Secondary | ICD-10-CM | POA: Diagnosis not present

## 2024-04-19 DIAGNOSIS — D2271 Melanocytic nevi of right lower limb, including hip: Secondary | ICD-10-CM | POA: Diagnosis not present

## 2024-04-19 DIAGNOSIS — D2272 Melanocytic nevi of left lower limb, including hip: Secondary | ICD-10-CM | POA: Diagnosis not present
